# Patient Record
Sex: Female | Born: 1940 | Race: White | Hispanic: No | Smoking: Never smoker
Health system: Southern US, Community
[De-identification: ages and names within clinical notes are randomized; demographics above are authoritative.]

## PROBLEM LIST (undated history)

## (undated) DIAGNOSIS — J449 Chronic obstructive pulmonary disease, unspecified: Secondary | ICD-10-CM

## (undated) DIAGNOSIS — M1990 Unspecified osteoarthritis, unspecified site: Secondary | ICD-10-CM

## (undated) DIAGNOSIS — I10 Essential (primary) hypertension: Secondary | ICD-10-CM

---

## 2006-12-26 ENCOUNTER — Other Ambulatory Visit: Payer: Self-pay

## 2006-12-26 ENCOUNTER — Emergency Department: Payer: Self-pay | Admitting: Emergency Medicine

## 2008-05-03 ENCOUNTER — Inpatient Hospital Stay: Payer: Self-pay | Admitting: Internal Medicine

## 2014-11-02 ENCOUNTER — Encounter: Payer: Self-pay | Admitting: Emergency Medicine

## 2014-11-02 ENCOUNTER — Emergency Department: Payer: Medicare Other

## 2014-11-02 ENCOUNTER — Emergency Department
Admission: EM | Admit: 2014-11-02 | Discharge: 2014-11-02 | Disposition: A | Discharge status: Home / Self Care | Payer: Medicare Other | Attending: Emergency Medicine | Admitting: Emergency Medicine

## 2014-11-02 DIAGNOSIS — Z23 Encounter for immunization: Secondary | ICD-10-CM | POA: Insufficient documentation

## 2014-11-02 DIAGNOSIS — S8002XA Contusion of left knee, initial encounter: Secondary | ICD-10-CM | POA: Diagnosis not present

## 2014-11-02 DIAGNOSIS — Y9389 Activity, other specified: Secondary | ICD-10-CM | POA: Insufficient documentation

## 2014-11-02 DIAGNOSIS — S82209A Unspecified fracture of shaft of unspecified tibia, initial encounter for closed fracture: Secondary | ICD-10-CM

## 2014-11-02 DIAGNOSIS — S8992XA Unspecified injury of left lower leg, initial encounter: Secondary | ICD-10-CM | POA: Insufficient documentation

## 2014-11-02 DIAGNOSIS — S0083XA Contusion of other part of head, initial encounter: Secondary | ICD-10-CM | POA: Diagnosis not present

## 2014-11-02 DIAGNOSIS — S82252A Displaced comminuted fracture of shaft of left tibia, initial encounter for closed fracture: Secondary | ICD-10-CM | POA: Diagnosis not present

## 2014-11-02 DIAGNOSIS — Y9289 Other specified places as the place of occurrence of the external cause: Secondary | ICD-10-CM | POA: Insufficient documentation

## 2014-11-02 DIAGNOSIS — S51811A Laceration without foreign body of right forearm, initial encounter: Secondary | ICD-10-CM | POA: Diagnosis not present

## 2014-11-02 DIAGNOSIS — Y998 Other external cause status: Secondary | ICD-10-CM | POA: Insufficient documentation

## 2014-11-02 DIAGNOSIS — Z880 Allergy status to penicillin: Secondary | ICD-10-CM | POA: Insufficient documentation

## 2014-11-02 DIAGNOSIS — S0993XA Unspecified injury of face, initial encounter: Secondary | ICD-10-CM | POA: Diagnosis present

## 2014-11-02 DIAGNOSIS — T148XXA Other injury of unspecified body region, initial encounter: Secondary | ICD-10-CM

## 2014-11-02 DIAGNOSIS — S82202A Unspecified fracture of shaft of left tibia, initial encounter for closed fracture: Secondary | ICD-10-CM

## 2014-11-02 DIAGNOSIS — S51012A Laceration without foreign body of left elbow, initial encounter: Secondary | ICD-10-CM | POA: Diagnosis not present

## 2014-11-02 DIAGNOSIS — S51019A Laceration without foreign body of unspecified elbow, initial encounter: Secondary | ICD-10-CM

## 2014-11-02 HISTORY — DX: Unspecified osteoarthritis, unspecified site: M19.90

## 2014-11-02 MED ORDER — TETANUS-DIPHTHERIA TOXOIDS TD 5-2 LFU IM INJ
0.5000 mL | INJECTION | Freq: Once | INTRAMUSCULAR | Status: AC
  Administered 2014-11-02: 0.5 mL via INTRAMUSCULAR
  Filled 2014-11-02: qty 0.5

## 2014-11-02 MED ORDER — PREDNISONE 5 MG PO TABS: 5.0000 mg | ORAL_TABLET | Freq: Every day | ORAL | Status: AC

## 2014-11-02 NOTE — ED Notes (Signed)
MD at bedside. 
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
## 2014-11-02 NOTE — ED Notes (Signed)
Patient transported to radiology
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
## 2014-11-02 NOTE — ED Notes (Signed)
Patient transported to CT 

## 2014-11-18 ENCOUNTER — Emergency Department: Payer: Medicare Other

## 2014-11-18 ENCOUNTER — Inpatient Hospital Stay
Admission: EM | Admit: 2014-11-18 | Discharge: 2014-11-23 | DRG: 470 | Disposition: A | Discharge status: Skilled Nursing Facility | Payer: Medicare Other | Attending: Internal Medicine | Admitting: Internal Medicine

## 2014-11-18 DIAGNOSIS — S82152A Displaced fracture of left tibial tuberosity, initial encounter for closed fracture: Secondary | ICD-10-CM

## 2014-11-18 DIAGNOSIS — J449 Chronic obstructive pulmonary disease, unspecified: Secondary | ICD-10-CM | POA: Diagnosis present

## 2014-11-18 DIAGNOSIS — I10 Essential (primary) hypertension: Secondary | ICD-10-CM | POA: Diagnosis present

## 2014-11-18 DIAGNOSIS — Y92008 Other place in unspecified non-institutional (private) residence as the place of occurrence of the external cause: Secondary | ICD-10-CM

## 2014-11-18 DIAGNOSIS — Z7982 Long term (current) use of aspirin: Secondary | ICD-10-CM | POA: Diagnosis not present

## 2014-11-18 DIAGNOSIS — Z833 Family history of diabetes mellitus: Secondary | ICD-10-CM

## 2014-11-18 DIAGNOSIS — Z880 Allergy status to penicillin: Secondary | ICD-10-CM

## 2014-11-18 DIAGNOSIS — Z806 Family history of leukemia: Secondary | ICD-10-CM | POA: Diagnosis not present

## 2014-11-18 DIAGNOSIS — S72002A Fracture of unspecified part of neck of left femur, initial encounter for closed fracture: Secondary | ICD-10-CM | POA: Diagnosis present

## 2014-11-18 DIAGNOSIS — W010XXA Fall on same level from slipping, tripping and stumbling without subsequent striking against object, initial encounter: Secondary | ICD-10-CM | POA: Diagnosis present

## 2014-11-18 DIAGNOSIS — M4854XA Collapsed vertebra, not elsewhere classified, thoracic region, initial encounter for fracture: Secondary | ICD-10-CM | POA: Diagnosis present

## 2014-11-18 DIAGNOSIS — Z419 Encounter for procedure for purposes other than remedying health state, unspecified: Secondary | ICD-10-CM

## 2014-11-18 DIAGNOSIS — D649 Anemia, unspecified: Secondary | ICD-10-CM | POA: Diagnosis present

## 2014-11-18 DIAGNOSIS — Z7983 Long term (current) use of bisphosphonates: Secondary | ICD-10-CM | POA: Diagnosis not present

## 2014-11-18 DIAGNOSIS — Z882 Allergy status to sulfonamides status: Secondary | ICD-10-CM | POA: Diagnosis not present

## 2014-11-18 DIAGNOSIS — E876 Hypokalemia: Secondary | ICD-10-CM | POA: Diagnosis present

## 2014-11-18 DIAGNOSIS — S72009A Fracture of unspecified part of neck of unspecified femur, initial encounter for closed fracture: Secondary | ICD-10-CM | POA: Diagnosis present

## 2014-11-18 DIAGNOSIS — M069 Rheumatoid arthritis, unspecified: Secondary | ICD-10-CM | POA: Diagnosis present

## 2014-11-18 DIAGNOSIS — I340 Nonrheumatic mitral (valve) insufficiency: Secondary | ICD-10-CM | POA: Diagnosis present

## 2014-11-18 DIAGNOSIS — Z8249 Family history of ischemic heart disease and other diseases of the circulatory system: Secondary | ICD-10-CM | POA: Diagnosis not present

## 2014-11-18 DIAGNOSIS — Z7952 Long term (current) use of systemic steroids: Secondary | ICD-10-CM | POA: Diagnosis not present

## 2014-11-18 DIAGNOSIS — Z79899 Other long term (current) drug therapy: Secondary | ICD-10-CM

## 2014-11-18 DIAGNOSIS — M810 Age-related osteoporosis without current pathological fracture: Secondary | ICD-10-CM | POA: Diagnosis present

## 2014-11-18 DIAGNOSIS — G8918 Other acute postprocedural pain: Secondary | ICD-10-CM

## 2014-11-18 DIAGNOSIS — S82209A Unspecified fracture of shaft of unspecified tibia, initial encounter for closed fracture: Secondary | ICD-10-CM

## 2014-11-18 DIAGNOSIS — S72001A Fracture of unspecified part of neck of right femur, initial encounter for closed fracture: Secondary | ICD-10-CM

## 2014-11-18 HISTORY — DX: Chronic obstructive pulmonary disease, unspecified: J44.9

## 2014-11-18 HISTORY — DX: Essential (primary) hypertension: I10

## 2014-11-18 LAB — BASIC METABOLIC PANEL
Anion gap: 13 (ref 5–15)
BUN: 22 mg/dL — AB (ref 6–20)
CHLORIDE: 108 mmol/L (ref 101–111)
CO2: 21 mmol/L — ABNORMAL LOW (ref 22–32)
CREATININE: 0.79 mg/dL (ref 0.44–1.00)
Calcium: 7.8 mg/dL — ABNORMAL LOW (ref 8.9–10.3)
GFR calc Af Amer: >60 mL/min (ref >60)
GFR calc non Af Amer: >60 mL/min (ref >60)
Glucose, Bld: 99 mg/dL (ref 65–99)
Potassium: 3.5 mmol/L (ref 3.5–5.1)
SODIUM: 142 mmol/L (ref 135–145)

## 2014-11-18 LAB — CBC WITH DIFFERENTIAL/PLATELET
Basophils Absolute: 0.1 10*3/uL (ref 0–0.1)
Basophils Relative: 1 %
EOS ABS: 0.1 10*3/uL (ref 0–0.7)
EOS PCT: 2 %
HCT: 31.9 % — ABNORMAL LOW (ref 35.0–47.0)
Hemoglobin: 10.5 g/dL — ABNORMAL LOW (ref 12.0–16.0)
LYMPHS ABS: 0.4 10*3/uL — AB (ref 1.0–3.6)
Lymphocytes Relative: 6 %
MCH: 32.5 pg (ref 26.0–34.0)
MCHC: 32.8 g/dL (ref 32.0–36.0)
MCV: 99 fL (ref 80.0–100.0)
Monocytes Absolute: 0.4 10*3/uL (ref 0.2–0.9)
Monocytes Relative: 5 %
Neutro Abs: 6.4 10*3/uL (ref 1.4–6.5)
Neutrophils Relative %: 86 %
PLATELETS: 317 10*3/uL (ref 150–440)
RBC: 3.23 MIL/uL — AB (ref 3.80–5.20)
RDW: 16.3 % — AB (ref 11.5–14.5)
WBC: 7.4 10*3/uL (ref 3.6–11.0)

## 2014-11-18 LAB — PROTIME-INR
INR: 1.19
PROTHROMBIN TIME: 15.3 s — AB (ref 11.4–15.0)

## 2014-11-18 LAB — CK: Total CK: 338 U/L — ABNORMAL HIGH (ref 38–234)

## 2014-11-18 MED ORDER — CALCIUM CARBONATE-VITAMIN D 500-200 MG-UNIT PO TABS
1.0000 | ORAL_TABLET | Freq: Two times a day (BID) | ORAL | Status: DC
  Administered 2014-11-18 – 2014-11-23 (×8): 1 via ORAL
  Filled 2014-11-18 (×9): qty 1

## 2014-11-18 MED ORDER — MORPHINE SULFATE (PF) 2 MG/ML IV SOLN: 2.0000 mg | INTRAVENOUS | Status: DC | PRN

## 2014-11-18 MED ORDER — ENOXAPARIN SODIUM 40 MG/0.4ML ~~LOC~~ SOLN
40.0000 mg | SUBCUTANEOUS | Status: DC
  Administered 2014-11-18: 40 mg via SUBCUTANEOUS
  Filled 2014-11-18: qty 0.4

## 2014-11-18 MED ORDER — MAGNESIUM HYDROXIDE 400 MG/5ML PO SUSP: 30.0000 mL | Freq: Every day | ORAL | Status: DC | PRN

## 2014-11-18 MED ORDER — HYDROCODONE-ACETAMINOPHEN 5-325 MG PO TABS
1.0000 | ORAL_TABLET | Freq: Four times a day (QID) | ORAL | Status: DC | PRN
  Administered 2014-11-19: 1 via ORAL
  Filled 2014-11-18: qty 1

## 2014-11-18 MED ORDER — ZOLPIDEM TARTRATE 5 MG PO TABS
5.0000 mg | ORAL_TABLET | Freq: Every evening | ORAL | Status: DC | PRN
  Administered 2014-11-22: 5 mg via ORAL
  Filled 2014-11-18: qty 1

## 2014-11-18 MED ORDER — SODIUM CHLORIDE 0.9 % IV SOLN
INTRAVENOUS | Status: DC
  Administered 2014-11-18 – 2014-11-19 (×2): via INTRAVENOUS

## 2014-11-18 MED ORDER — CALCIUM CARBONATE-VITAMIN D 600-400 MG-UNIT PO TABS: 1.0000 | ORAL_TABLET | Freq: Two times a day (BID) | ORAL | Status: DC

## 2014-11-18 MED ORDER — PREDNISONE 5 MG PO TABS
5.0000 mg | ORAL_TABLET | Freq: Every day | ORAL | Status: DC
  Administered 2014-11-19 – 2014-11-23 (×3): 5 mg via ORAL
  Filled 2014-11-18 (×3): qty 1

## 2014-11-18 MED ORDER — MORPHINE SULFATE (PF) 2 MG/ML IV SOLN
2.0000 mg | INTRAVENOUS | Status: DC | PRN
  Administered 2014-11-18 – 2014-11-19 (×2): 2 mg via INTRAVENOUS
  Filled 2014-11-18 (×2): qty 1

## 2014-11-18 MED ORDER — LISINOPRIL 20 MG PO TABS
20.0000 mg | ORAL_TABLET | Freq: Every day | ORAL | Status: DC
  Administered 2014-11-18 – 2014-11-23 (×4): 20 mg via ORAL
  Filled 2014-11-18 (×4): qty 1

## 2014-11-18 MED ORDER — DOCUSATE SODIUM 100 MG PO CAPS
100.0000 mg | ORAL_CAPSULE | Freq: Two times a day (BID) | ORAL | Status: DC
  Administered 2014-11-18 – 2014-11-23 (×7): 100 mg via ORAL
  Filled 2014-11-18 (×9): qty 1

## 2014-11-18 NOTE — H&P (Addendum)
[facility] [place] at [facility]   PATIENT NAME: Morgan Patterson    MR#:  [id number]  DATE OF BIRTH:  1940/04/19  DATE OF ADMISSION:  11/18/2014  PRIMARY CARE PHYSICIAN: Kandyce Rud, MD   REQUESTING/REFERRING PHYSICIAN: Scotty Court  CHIEF COMPLAINT:   Chief Complaint  Patient presents with  . Fall    HISTORY OF PRESENT ILLNESS: Morgan Patterson  is a 74 y.o. female with a known history of arthritis, hypertension, COPD- tripped over her carpet on Saturday night in her back porch, and since then she had severe pain in her left hip she could not get up. She stayed in the porch Saturday night just slept they're in the next day in the morning when she woke up she opened the door and is post herself inside, then she stayed on the floor and could not get up finally today called her causing who came to her house and he helped her to get in the bed and then called the EMS. In ER noted to have fracture on left hip. Orthopedics is planning to surgery day after tomorrow.  PAST MEDICAL HISTORY:   Past Medical History  Diagnosis Date  . Arthritis   . Hypertension   . COPD (chronic obstructive pulmonary disease) (HCC)     PAST SURGICAL HISTORY: No past surgical history on file.  SOCIAL HISTORY:  Social History  Substance Use Topics  . Smoking status: Never Smoker   . Smokeless tobacco: Not on file  . Alcohol Use: No    FAMILY HISTORY:  Family History  Problem Relation Age of Onset  . Diabetes Mother   . CAD Mother   . Hypertension Mother   . Leukemia Father     DRUG ALLERGIES:  Allergies  Allergen Reactions  . Penicillins Itching, Swelling and Other (See Comments)    Has patient had a PCN reaction causing immediate rash, facial/tongue/throat swelling, SOB or lightheadedness with hypotension: No Has patient had a PCN reaction causing severe rash involving mucus membranes or skin necrosis: No Has patient had a PCN reaction that required hospitalization  No Has patient had a PCN reaction occurring within the last 10 years: No If all of the above answers are "NO", then may proceed with Cephalosporin use.  . Sulfa Antibiotics Itching and Swelling    REVIEW OF SYSTEMS:   CONSTITUTIONAL: No fever, fatigue or weakness.  EYES: No blurred or double vision.  EARS, NOSE, AND THROAT: No tinnitus or ear pain.  RESPIRATORY: No cough, shortness of breath, wheezing or hemoptysis.  CARDIOVASCULAR: No chest pain, orthopnea, edema.  GASTROINTESTINAL: No nausea, vomiting, diarrhea or abdominal pain.  GENITOURINARY: No dysuria, hematuria.  ENDOCRINE: No polyuria, nocturia,  HEMATOLOGY: No anemia, easy bruising or bleeding SKIN: No rash or lesion. MUSCULOSKELETAL: severe left hip joint pain or arthritis.   NEUROLOGIC: No tingling, numbness, weakness.  PSYCHIATRY: No anxiety or depression.   MEDICATIONS AT HOME:  Prior to Admission medications   Medication Sig Start Date End Date Taking? Authorizing Provider  alendronate (FOSAMAX) 70 MG tablet Take 70 mg by mouth once a week. Pt takes on Friday.   Take with a full glass of water on an empty stomach.   Yes Historical Provider, MD  aspirin 500 MG tablet Take 1,000-1,500 mg by mouth 2 (two) times daily. Pt takes three tablets in the morning and two tablets at bedtime.   Yes Historical Provider, MD  Calcium Carbonate-Vitamin D (CALCIUM 600+D) 600-400 MG-UNIT tablet Take 1 tablet  by mouth 2 (two) times daily.   Yes Historical Provider, MD  lisinopril (PRINIVIL,ZESTRIL) 20 MG tablet Take 20 mg by mouth daily.   Yes Historical Provider, MD  methotrexate (RHEUMATREX) 2.5 MG tablet Take 22.5 mg by mouth once a week. Pt takes on Friday.   Caution:Chemotherapy. Protect from light.   Yes Historical Provider, MD  Multiple Vitamin (MULTIVITAMIN WITH MINERALS) TABS tablet Take 1 tablet by mouth daily.   Yes Historical Provider, MD  predniSONE (DELTASONE) 5 MG tablet Take 1 tablet (5 mg total) by mouth daily with  breakfast. 11/02/14  Yes Myrna Blazer, MD      PHYSICAL EXAMINATION:   VITAL SIGNS: Blood pressure 144/77, pulse 73, temperature 97.9 F (36.6 C), resp. rate 16, height 5\' 1"  (1.549 m), weight 54.432 kg (120 lb), SpO2 100 %.  GENERAL:  74 y.o.-year-old patient lying in the bed with no acute distress.  EYES: Pupils equal, round, reactive to light and accommodation. No scleral icterus. Extraocular muscles intact.  HEENT: Head atraumatic, normocephalic. Oropharynx and nasopharynx clear.  NECK:  Supple, no jugular venous distention. No thyroid enlargement, no tenderness.  LUNGS: Normal breath sounds bilaterally, no wheezing, rales,rhonchi or crepitation. No use of accessory muscles of respiration.  CARDIOVASCULAR: S1, S2 normal. Systolic murmurs present.  ABDOMEN: Soft, nontender, nondistended. Bowel sounds present. No organomegaly or mass.  EXTREMITIES:  Left lower extremity externally rotated and shortening, also have some bruises around her knee on the left side, and bluish color around knee because of bleeding. Able to move her toes and her feet distal pulses are intact and sensations are intact. NEUROLOGIC: Cranial nerves II through XII are intact. Muscle strength 5/5 in all extremities except left lower due to fracture. Sensation intact. Gait not checked.  PSYCHIATRIC: The patient is alert and oriented x 3.  SKIN: No obvious rash, lesion, or ulcer.   LABORATORY PANEL:   CBC  Recent Labs Lab 11/18/14 1807  WBC 7.4  HGB 10.5*  HCT 31.9*  PLT 317  MCV 99.0  MCH 32.5  MCHC 32.8  RDW 16.3*  LYMPHSABS 0.4*  MONOABS 0.4  EOSABS 0.1  BASOSABS 0.1   ------------------------------------------------------------------------------------------------------------------  Chemistries   Recent Labs Lab 11/18/14 1807  NA 142  K 3.5  CL 108  CO2 21*  GLUCOSE 99  BUN 22*  CREATININE 0.79  CALCIUM 7.8*    ------------------------------------------------------------------------------------------------------------------ estimated creatinine clearance is 45.8 mL/min (by C-G formula based on Cr of 0.79). ------------------------------------------------------------------------------------------------------------------ No results for input(s): TSH, T4TOTAL, T3FREE, THYROIDAB in the last 72 hours.  Invalid input(s): FREET3   Coagulation profile  Recent Labs Lab 11/18/14 1807  INR 1.19   ------------------------------------------------------------------------------------------------------------------- No results for input(s): DDIMER in the last 72 hours. -------------------------------------------------------------------------------------------------------------------  Cardiac Enzymes No results for input(s): CKMB, TROPONINI, MYOGLOBIN in the last 168 hours.  Invalid input(s): CK ------------------------------------------------------------------------------------------------------------------ Invalid input(s): POCBNP  ---------------------------------------------------------------------------------------------------------------  Urinalysis No results found for: COLORURINE, APPEARANCEUR, LABSPEC, PHURINE, GLUCOSEU, HGBUR, BILIRUBINUR, KETONESUR, PROTEINUR, UROBILINOGEN, NITRITE, LEUKOCYTESUR   RADIOLOGY: Dg Chest 1 View  11/18/2014  CLINICAL DATA:  Pain following fall EXAM: CHEST 1 VIEW COMPARISON:  May 05, 2008 FINDINGS: There is no edema or consolidation. The heart size and pulmonary vascularity are normal. No adenopathy. There is calcification in the mitral annulus. There are multiple wedge compression fractures throughout the mid and lower thoracic spine. IMPRESSION: Multiple age uncertain compression fractures in the thoracic spine. No edema or consolidation. No pneumothorax. Electronically Signed   By: May 07, 2008 III M.D.  On: 11/18/2014 17:51   Dg Hip Unilat With Pelvis  2-3 Views Left  11/18/2014  CLINICAL DATA:  Larey Seat Saturday onto buttocks, LEFT hip pain and deformity, has been lying in bed for 3 days, unable to walk EXAM: DG HIP (WITH OR WITHOUT PELVIS) 2-3V LEFT COMPARISON:  None FINDINGS: Diffuse osseous demineralization. Minimal narrowing of RIGHT hip joint. LEFT hip joint and SI joints preserved. Displaced LEFT femoral neck fracture without dislocation. Osseous pelvis appears intact. Facet degenerative changes at visualized lower lumbar spine. IMPRESSION: Displaced LEFT femoral neck fracture. Osseous demineralization. Electronically Signed   By: Ulyses Southward M.D.   On: 11/18/2014 17:53    IMPRESSION AND PLAN: * Left hip fracture  Seen by orthopedics, plan for surgery day after tomorrow.  Meanwhile DVT prophylaxis and pain management per orthopedic team.  Urinalysis is not checked I will check it.  * Possible rhabdomyolysis  Patient stayed on the floor for 3 days  Her CK level is not checked I will check that.  Give IV fluid if required, kidney function is acceptable.  * Cardiac murmurs  We does not have echocardiogram in system, we'll get echo.  * COPD  No active wheezing, continue monitoring.  * Rheumatoid arthritis  She takes methotrexate and steroids at home  Continue after surgery.  * Osteoporosis  Takes calcium and vitamin D, continue after surgery.    All the records are reviewed and case discussed with ED provider. Management plans discussed with the patient, family and they are in agreement.  CODE STATUS:    Code Status Orders        Start     Ordered   11/18/14 1921  Full code   Continuous     11/18/14 1925       TOTAL TIME TAKING CARE OF THIS PATIENT: 45 minutes.    Altamese Dilling M.D on 11/18/2014   Between 7am to 6pm - Pager - [phone]  After 6pm go to [email or web address] - password [email or web address] [facility]  [facility]  Office  [phone]  CC: Primary care physician; Kandyce Rud,  MD   Note: This dictation was prepared with Dragon dictation along with smaller phrase technology. Any transcriptional errors that result from this process are unintentional.

## 2014-11-18 NOTE — Progress Notes (Signed)
Pt. Has valuables locked up in locker.

## 2014-11-18 NOTE — ED Notes (Signed)
Pt arrived from home via [facility], fell Sat and C/O L. Knee and hip pain. Taking aspirin

## 2014-11-18 NOTE — Consult Note (Signed)
Patient suffered a fall this weekend. She has had a recent proximal tibia fracture. She has been walking with one crutch. 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 She is treated by [facility] for that condition.  She has been in bed since her fall she called in from the back porch where she tripped and is having left hip pain.  Exam the left leg is shortened external rotated. She is able flex extend the toes. She has started herself pedis posterior tibial pulse and there is no significant peripheral edema.  X-rays show displaced left femoral neck fracture complete displacement.  Impression displaced femoral neck fracture in patient with inflammatory arthritis  Plan of left total hip arthritis the direct anterior approach probably the day after tomorrow to allow for medical stabilization with her being in bed for 3 days. Risks benefits, and possible complications discussed. Brochure regarding the procedure was also given to patient. We'll obtain the x-ray to make sure tibial tuberosity fracture is not displaced

## 2014-11-18 NOTE — ED Provider Notes (Signed)
[facility] Emergency Department Provider Note  ____________________________________________  Time seen: 5:10 PM  I have reviewed the triage vital signs and the nursing notes.   HISTORY  Chief Complaint Fall    HPI Morgan Patterson is a 74 y.o. female who is been walking around using a crutch recently due to a recent left knee injury 2 weeks ago. On returning home from a shopping trip 3 days ago, she was walking into the house with the crotch when it got stuck in the carpet causing her to spin around and fall onto her back left side.Denies other injuries, no head injury loss of consciousness or neck pain. No numbness [place] or weakness in the leg. She's been unable to get out of bed for the past 3 days.     Past Medical History  Diagnosis Date  . Arthritis      There are no active problems to display for this patient.    No past surgical history on file.   Current Outpatient Rx  Name  Route  Sig  Dispense  Refill  . predniSONE (DELTASONE) 5 MG tablet   Oral   Take 1 tablet (5 mg total) by mouth daily with breakfast.   7 tablet   0      Allergies Penicillins   No family history on file.  Social History Social History  Substance Use Topics  . Smoking status: None  . Smokeless tobacco: None  . Alcohol Use: None   denies tobacco alcohol or drug use  Review of Systems  Constitutional:   No fever or chills. No weight changes Eyes:   No blurry vision or double vision.  ENT:   No sore throat. Cardiovascular:   No chest pain. Respiratory:   No dyspnea or cough. Gastrointestinal:   Negative for abdominal pain, vomiting and diarrhea.  No BRBPR or melena. Genitourinary:   Negative for dysuria, urinary retention, bloody urine, or difficulty urinating. Musculoskeletal:   Positive left knee and left hip pain Skin:   Negative for rash. Neurological:   Negative for headaches, focal weakness or numbness. Psychiatric:  No anxiety or  depression.   Endocrine:  No hot/cold intolerance, changes in energy, or sleep difficulty.  10-point ROS otherwise negative.  ____________________________________________   PHYSICAL EXAM:  VITAL SIGNS: ED Triage Vitals  Enc Vitals Group     BP 11/18/14 1708 137/82 mmHg     Pulse Rate 11/18/14 1708 111     Resp 11/18/14 1708 16     Temp 11/18/14 1708 97.9 F (36.6 C)     Temp src --      SpO2 11/18/14 1708 100 %     Weight 11/18/14 1708 120 lb (54.432 kg)     Height 11/18/14 1708 5\' 1"  (1.549 m)     Head Cir --      Peak Flow --      Pain Score 11/18/14 1712 2     Pain Loc --      Pain Edu? --      Excl. in GC? --      Constitutional:   Alert and oriented. Well appearing and in no distress. Eyes:   No scleral icterus. No conjunctival pallor. PERRL. EOMI ENT   Head:   Normocephalic and atraumatic.   Nose:   No congestion/rhinnorhea. No septal hematoma   Mouth/Throat:   MMM, no pharyngeal erythema. No peritonsillar mass. No uvula shift.   Neck:   No stridor. No SubQ emphysema. No  meningismus. Hematological/Lymphatic/Immunilogical:   No cervical lymphadenopathy. Cardiovascular:   RRR. Normal and symmetric distal pulses are present in all extremities. No murmurs, rubs, or gallops. Respiratory:   Normal respiratory effort without tachypnea nor retractions. Breath sounds are clear and equal bilaterally. No wheezes/rales/rhonchi. Gastrointestinal:   Soft and nontender. No distention. There is no CVA tenderness.  No rebound, rigidity, or guarding. Genitourinary:   deferred Musculoskeletal:   Focal left hip tenderness. There is ecchymosis over the left knee but the area is grossly nontender. Other extremity is unremarkable.. Neurologic:   Normal speech and language.  CN 2-10 normal. Motor grossly intact. No pronator drift.  Normal gait. No gross focal neurologic deficits are appreciated.  Skin:    Skin is warm, dry and intact. No rash noted.  No petechiae,  purpura, or bullae. Psychiatric:   Mood and affect are normal. Speech and behavior are normal. Patient exhibits appropriate insight and judgment.  ____________________________________________    LABS (pertinent positives/negatives) (all labs ordered are listed, but only abnormal results are displayed) Labs Reviewed  BASIC METABOLIC PANEL  CBC WITH DIFFERENTIAL/PLATELET  PROTIME-INR   ____________________________________________   EKG  Interpreted by me Sinus tachycardia rate 116, significant respiratory variation, left axis, normal intervals. Normal QRS ST segments and T waves.  ____________________________________________    RADIOLOGY  X-ray left hip shows a left femoral neck fracture. Chest x-ray shows multiple T spine compression fractures  ____________________________________________   PROCEDURES   ____________________________________________   INITIAL IMPRESSION / ASSESSMENT AND PLAN / ED COURSE  Pertinent labs & imaging results that were available during my care of the patient were reviewed by me and considered in my medical decision making (see chart for details).  Patient presents with left hip fracture after a fall. Chest x-ray concerning for compression fractures of the thoracic spine, however the patient has no pain complaints in that area at this time and so this is likely not acute. Given the patient has been unable to get out of bed for the last 3 days after her fall, We'll discuss with orthopedics and hospitalist for further management.     ____________________________________________   FINAL CLINICAL IMPRESSION(S) / ED DIAGNOSES  Final diagnoses:  Displaced fracture of right femoral neck, closed, initial encounter [facility])      Morgan Cheek, MD 11/18/14 1826

## 2014-11-19 ENCOUNTER — Inpatient Hospital Stay
Admit: 2014-11-19 | Discharge: 2014-11-19 | Disposition: A | Discharge status: Home / Self Care | Payer: Medicare Other | Attending: Internal Medicine | Admitting: Internal Medicine

## 2014-11-19 LAB — URINALYSIS COMPLETE WITH MICROSCOPIC (ARMC ONLY)
BILIRUBIN URINE: NEGATIVE
Bacteria, UA: NONE SEEN
GLUCOSE, UA: NEGATIVE mg/dL
HGB URINE DIPSTICK: NEGATIVE
LEUKOCYTES UA: NEGATIVE
NITRITE: NEGATIVE
Protein, ur: 30 mg/dL — AB
SPECIFIC GRAVITY, URINE: 1.025 (ref 1.005–1.030)
pH: 5 (ref 5.0–8.0)

## 2014-11-19 LAB — BASIC METABOLIC PANEL
Anion gap: 8 (ref 5–15)
BUN: 28 mg/dL — AB (ref 6–20)
CHLORIDE: 112 mmol/L — AB (ref 101–111)
CO2: 22 mmol/L (ref 22–32)
CREATININE: 0.83 mg/dL (ref 0.44–1.00)
Calcium: 8.2 mg/dL — ABNORMAL LOW (ref 8.9–10.3)
GFR calc non Af Amer: >60 mL/min (ref >60)
Glucose, Bld: 86 mg/dL (ref 65–99)
Potassium: 3.3 mmol/L — ABNORMAL LOW (ref 3.5–5.1)
Sodium: 142 mmol/L (ref 135–145)

## 2014-11-19 LAB — MAGNESIUM: Magnesium: 2.2 mg/dL (ref 1.7–2.4)

## 2014-11-19 LAB — CBC
HEMATOCRIT: 30.9 % — AB (ref 35.0–47.0)
Hemoglobin: 10.5 g/dL — ABNORMAL LOW (ref 12.0–16.0)
MCH: 33.7 pg (ref 26.0–34.0)
MCHC: 34 g/dL (ref 32.0–36.0)
MCV: 99.2 fL (ref 80.0–100.0)
PLATELETS: 303 10*3/uL (ref 150–440)
RBC: 3.12 MIL/uL — ABNORMAL LOW (ref 3.80–5.20)
RDW: 16.1 % — ABNORMAL HIGH (ref 11.5–14.5)
WBC: 6.1 10*3/uL (ref 3.6–11.0)

## 2014-11-19 LAB — SURGICAL PCR SCREEN
MRSA, PCR: NEGATIVE
Staphylococcus aureus: POSITIVE — AB

## 2014-11-19 MED ORDER — POTASSIUM CHLORIDE CRYS ER 20 MEQ PO TBCR
20.0000 meq | EXTENDED_RELEASE_TABLET | Freq: Two times a day (BID) | ORAL | Status: AC
  Administered 2014-11-19 (×2): 20 meq via ORAL
  Filled 2014-11-19 (×2): qty 1

## 2014-11-19 MED ORDER — IPRATROPIUM-ALBUTEROL 0.5-2.5 (3) MG/3ML IN SOLN
3.0000 mL | RESPIRATORY_TRACT | Status: DC | PRN
  Administered 2014-11-22 – 2014-11-23 (×2): 3 mL via RESPIRATORY_TRACT
  Filled 2014-11-19 (×2): qty 3

## 2014-11-19 MED ORDER — POTASSIUM CHLORIDE IN NACL 20-0.9 MEQ/L-% IV SOLN
INTRAVENOUS | Status: DC
  Administered 2014-11-19: 15:00:00 via INTRAVENOUS
  Filled 2014-11-19 (×2): qty 1000

## 2014-11-19 MED ORDER — ENSURE ENLIVE PO LIQD
237.0000 mL | Freq: Three times a day (TID) | ORAL | Status: DC
  Administered 2014-11-19 – 2014-11-23 (×9): 237 mL via ORAL

## 2014-11-19 MED ORDER — CLINDAMYCIN PHOSPHATE 600 MG/50ML IV SOLN
600.0000 mg | INTRAVENOUS | Status: AC
  Filled 2014-11-19: qty 50

## 2014-11-19 NOTE — Progress Notes (Signed)
Patient appears comfortable. She had follow-up x-ray last evening and will see if Dr. Martha Clan can see her today to evaluate that tibial tubercle fracture and whether it might need ORIF and if we can do the same time. Plan on left anterior total hip tomorrow morning for displaced femoral neck fracture and inflammatory arthritis. We'll need perioperative steroid coverage

## 2014-11-19 NOTE — Progress Notes (Signed)
Initial Nutrition Assessment   INTERVENTION:   Meals and Snacks: Cater to patient preferences Medical Food Supplement Therapy: will recommend Ensure Enlive po BID, each supplement provides 350 kcal and 20 grams of protein   NUTRITION DIAGNOSIS:   Increased nutrient needs related to acute illness as evidenced by estimated needs.  GOAL:   Patient will meet greater than or equal to 90% of their needs  MONITOR:    (Energy Intake, Anthropometrics, Electrolyte and renal Profile)  REASON FOR ASSESSMENT:   Consult Hip fracture protocol  ASSESSMENT:   Pt admitted with left hip fracture scheduled for surgical intervention tomorrow. Per MD note, pt remained lying on the floor for 3 days PTA with possible rhabdomylosis.,  Past Medical History  Diagnosis Date  . Arthritis   . Hypertension   . COPD (chronic obstructive pulmonary disease) (HCC)     Diet Order:  Diet regular Room service appropriate?: Yes; Fluid consistency:: Thin Diet NPO time specified    Current Nutrition: Pt ate bites of chicken salad sandwich and green beans for lunch. Pt reports eating well at breakfast however bites recorded in I/O chart.   Food/Nutrition-Related History: Pt reports eating a hamburger at lunch around 2pm and eating salad from [facility] for example for dinner most days. Pt reports liking Ensure drinking in the past.   Scheduled Medications:  . calcium-vitamin D  1 tablet Oral BID  . clindamycin (CLEOCIN) IV  600 mg Intravenous On Call to OR  . docusate sodium  100 mg Oral BID  . feeding supplement (ENSURE ENLIVE)  237 mL Oral TID WC  . lisinopril  20 mg Oral Daily  . potassium chloride  20 mEq Oral BID  . predniSONE  5 mg Oral Q breakfast    Continuous Medications:  . 0.9 % NaCl with KCl 20 mEq / L 50 mL/hr at 11/19/14 1504     Electrolyte/Renal Profile and Glucose Profile:   Recent Labs Lab 11/18/14 1807 11/18/14 2305 11/19/14 0544  NA 142  --  142  K 3.5  --  3.3*  CL 108   --  112*  CO2 21*  --  22  BUN 22*  --  28*  CREATININE 0.79  --  0.83  CALCIUM 7.8*  --  8.2*  MG  --  2.2  --   GLUCOSE 99  --  86   Protein Profile: No results for input(s): ALBUMIN in the last 168 hours.  Gastrointestinal Profile: Last BM:  11/19/2014   Nutrition-Focused Physical Exam Findings: Nutrition-Focused physical exam completed. Findings are no fat depletion, mild muscle depletion of thigh only, and no edema.     Weight Change: pt reports UBW of 116-120lbs. Pt reports weighing 135lbs many years ago    Height:   Ht Readings from Last 1 Encounters:  11/19/14 5\' 1"  (1.549 m)    Weight:   Wt Readings from Last 1 Encounters:  11/19/14 117 lb 14.4 oz (53.479 kg)    Ideal Body Weight:     BMI:  Body mass index is 22.29 kg/(m^2).  Estimated Nutritional Needs:   Kcal:  BEE: 967kcals, TEE: (IF 1.1-1.3)(AF 1.2) 1276-1509kcals  Protein:  59-70g protein (1.1-1.3g/kg)  Fluid:  1338-1655mL of fluid (25-58mL/kg)  EDUCATION NEEDS:   No education needs identified at this time    MODERATE Care Level  31m, RD, LDN Pager [phone]

## 2014-11-19 NOTE — Clinical Social Work Placement (Signed)
   CLINICAL SOCIAL WORK PLACEMENT  NOTE  Date:  11/19/2014  Patient Details  Name: Morgan Patterson MRN: [id number] Date of Birth: [birth year]  Clinical Social Work is seeking post-discharge placement for this patient at the Skilled  Nursing Facility level of care (*CSW will initial, date and re-position this form in  chart as items are completed):  Yes   Patient/family provided with [place] Clinical Social Work Department's list of facilities offering this level of care within the geographic area requested by the patient (or if unable, by the patient's family).  Yes   Patient/family informed of their freedom to choose among providers that offer the needed level of care, that participate in Medicare, Medicaid or managed care program needed by the patient, have an available bed and are willing to accept the patient.  Yes   Patient/family informed of [facility]'s ownership interest in [facility] and [facility], as well as of the fact that they are under no obligation to receive care at these facilities.  PASRR submitted to EDS on       PASRR number received on       Existing PASRR number confirmed on 11/19/14     FL2 transmitted to all facilities in geographic area requested by pt/family on 11/19/14     FL2 transmitted to all facilities within larger geographic area on       Patient informed that his/her managed care company has contracts with or will negotiate with certain facilities, including the following:            Patient/family informed of bed offers received.  Patient chooses bed at       Physician recommends and patient chooses bed at      Patient to be transferred to   on  .  Patient to be transferred to facility by       Patient family notified on   of transfer.  Name of family member notified:        PHYSICIAN Please sign FL2     Additional Comment:    _______________________________________________ Haig Prophet, LCSW 11/19/2014, 10:20  AM

## 2014-11-19 NOTE — NC FL2 (Signed)
MEDICAID FL2 LEVEL OF CARE SCREENING TOOL     IDENTIFICATION  Patient Name: Morgan Patterson Birthdate: Jul 08, 1940 Sex: female Admission Date (Current Location): 11/18/2014  [place] and [place] Number:  Air cabin crew )   Facility and Address:  [facility], [address] [place], [place] [address]      Provider Number: [id number] [phone])  Attending Physician Name and Address:  Katharina Caper, MD  Relative Name and Phone Number:       Current Level of Care: Hospital Recommended Level of Care: Skilled Nursing Facility Prior Approval Number:    Date Approved/Denied:   PASRR Number:  [id number] )  Discharge Plan: SNF    Current Diagnoses: Patient Active Problem List   Diagnosis Date Noted  . Hip fracture, left (HCC) 11/18/2014  . Hip fracture (HCC) 11/18/2014    Orientation ACTIVITIES/SOCIAL BLADDER RESPIRATION    Self, Time, Situation, Place  Active Continent Normal  BEHAVIORAL SYMPTOMS/MOOD NEUROLOGICAL BOWEL NUTRITION STATUS   (none)  (none) Continent Diet (NPO for surgery )  PHYSICIAN VISITS COMMUNICATION OF NEEDS Height & Weight Skin  30 days Verbally 5\' 1"  (154.9 cm) 120 lbs. Surgical wounds (Incision: Left Hip. )          AMBULATORY STATUS RESPIRATION    Assist extensive Normal      Personal Care Assistance Level of Assistance  Bathing, Feeding, Dressing Bathing Assistance: Limited assistance Feeding assistance: Independent Dressing Assistance: Limited assistance      Functional Limitations Info  Sight, Hearing, Speech Sight Info: Adequate Hearing Info: Adequate Speech Info: Adequate       SPECIAL CARE FACTORS FREQUENCY  PT (By licensed PT), OT (By licensed OT)     PT Frequency:  (5) OT Frequency:  (5)           Additional Factors Info  Code Status, Allergies (Full Code. ) Code Status Info:  (Full Code. ) Allergies Info:  (Penicillins, Sulfa Antibiotics)           Current Medications  (11/19/2014): Current Facility-Administered Medications  Medication Dose Route Frequency Provider Last Rate Last Dose  . 0.9 %  sodium chloride infusion   Intravenous Continuous 13/02/2014, MD 75 mL/hr at 11/19/14 0956    . calcium-vitamin D (OSCAL WITH D) 500-200 MG-UNIT per tablet 1 tablet  1 tablet Oral BID 13/02/16, MD   1 tablet at 11/19/14 0954  . clindamycin (CLEOCIN) IVPB 600 mg  600 mg Intravenous On Call to OR 13/02/16, MD      . docusate sodium (COLACE) capsule 100 mg  100 mg Oral BID Kennedy Bucker, MD   100 mg at 11/19/14 0954  . enoxaparin (LOVENOX) injection 40 mg  40 mg Subcutaneous Q24H 13/02/16, MD   40 mg at 11/18/14 2235  . HYDROcodone-acetaminophen (NORCO/VICODIN) 5-325 MG per tablet 1-2 tablet  1-2 tablet Oral Q6H PRN 2236, MD   1 tablet at 11/19/14 1001  . lisinopril (PRINIVIL,ZESTRIL) tablet 20 mg  20 mg Oral Daily 13/02/16, MD   20 mg at 11/19/14 0954  . magnesium hydroxide (MILK OF MAGNESIA) suspension 30 mL  30 mL Oral Daily PRN 13/02/16, MD      . morphine 2 MG/ML injection 2 mg  2 mg Intravenous Q4H PRN Kennedy Bucker, MD   2 mg at 11/18/14 2235  . predniSONE (DELTASONE) tablet 5 mg  5 mg Oral Q breakfast 2236, MD   5 mg at 11/19/14 0954  . zolpidem (AMBIEN)  tablet 5 mg  5 mg Oral QHS PRN Kennedy Bucker, MD       Do not use this list as official medication orders. Please verify with discharge summary.  Discharge Medications:   Medication List    ASK your doctor about these medications        alendronate 70 MG tablet  Commonly known as:  FOSAMAX  Take 70 mg by mouth once a week. Pt takes on Friday.   Take with a full glass of water on an empty stomach.     aspirin 500 MG tablet  Take 1,000-1,500 mg by mouth 2 (two) times daily. Pt takes three tablets in the morning and two tablets at bedtime.     CALCIUM 600+D 600-400 MG-UNIT tablet  Generic drug:  Calcium Carbonate-Vitamin  D  Take 1 tablet by mouth 2 (two) times daily.     lisinopril 20 MG tablet  Commonly known as:  PRINIVIL,ZESTRIL  Take 20 mg by mouth daily.     methotrexate 2.5 MG tablet  Commonly known as:  RHEUMATREX  Take 22.5 mg by mouth once a week. Pt takes on Friday.   Caution:Chemotherapy. Protect from light.     multivitamin with minerals Tabs tablet  Take 1 tablet by mouth daily.     predniSONE 5 MG tablet  Commonly known as:  DELTASONE  Take 1 tablet (5 mg total) by mouth daily with breakfast.        Relevant Imaging Results:  Relevant Lab Results:  Recent Labs    Additional Information  (SSN: [id number])  Haig Prophet, LCSW

## 2014-11-19 NOTE — Progress Notes (Signed)
SNF and Non-Emergent EMS Transport Benefits:  Number called: [phone] Rep: Junie Panning Reference Number: [id number]  [employer] Medicare Advantage PPO plan active since of 01/17/13.  $290 deductible, $210.82 met so far.  Out of pocket max is $3290, of which $210.82 met so far.  In-network SNF: 20% co-insurance per day up to a per day max of $40 for days 1-20 and a 20% co-insurance per day up to a per day max of $160 for days 21-100.  Limited to 100 days each benefit period. $0 for professional fees and a 3 day hospital stay is not required.  Josem Kaufmann is required: [phone].    Non-emergent EMS transport: After $290 deductible is met,  20% co-insurance for each one way medically necessary, Medicare covered trip.  Josem Kaufmann is not required for PPO plans for service codes [phone] or [phone].
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
## 2014-11-19 NOTE — Progress Notes (Addendum)
[facility] - [place] at [facility]   PATIENT NAME: Morgan Patterson    MR#:  [id number]  DATE OF BIRTH:  12-16-1940  SUBJECTIVE:  CHIEF COMPLAINT:   Chief Complaint  Patient presents with  . Fall   patient is 74 year old Caucasian female with history of rheumatoid arthritis and recent left leg  proximal tibia fracture is back to the hospital with complaints of left leg pain after fall on the back porch. She was noted to have displaced left femoral neck fracture. Dr. Pablo Ledger was consulted and is contemplating surgery tomorrow. Patient admits of pain, which is relatively well controlled.   Review of Systems  Constitutional: Negative for fever, chills and weight loss.  HENT: Negative for congestion.   Eyes: Negative for blurred vision and double vision.  Respiratory: Negative for cough, sputum production, shortness of breath and wheezing.   Cardiovascular: Negative for chest pain, palpitations, orthopnea, leg swelling and PND.  Gastrointestinal: Negative for nausea, vomiting, abdominal pain, diarrhea, constipation and blood in stool.  Genitourinary: Negative for dysuria, urgency, frequency and hematuria.  Musculoskeletal: Negative for falls.  Neurological: Negative for dizziness, tremors, focal weakness and headaches.  Endo/Heme/Allergies: Does not bruise/bleed easily.  Psychiatric/Behavioral: Negative for depression. The patient does not have insomnia.     VITAL SIGNS: Blood pressure 119/76, pulse 109, temperature 99 F (37.2 C), temperature source Axillary, resp. rate 20, height 5\' 1"  (1.549 m), weight 54.432 kg (120 lb), SpO2 97 %.  PHYSICAL EXAMINATION:   GENERAL:  74 y.o.-year-old patient lying in the bed with no acute distress.  EYES: Pupils equal, round, reactive to light and accommodation. No scleral icterus. Extraocular muscles intact.  HEENT: Head atraumatic, normocephalic. Oropharynx and nasopharynx clear.  NECK:  Supple, no jugular venous distention.  No thyroid enlargement, no tenderness.  LUNGS: Normal breath sounds bilaterally, no wheezing, rales,rhonchi or crepitation. No use of accessory muscles of respiration.  CARDIOVASCULAR: S1, S2 normal. No murmurs, rubs, or gallops.  ABDOMEN: Soft, nontender, nondistended. Bowel sounds present. No organomegaly or mass.  EXTREMITIES: No pedal edema, cyanosis, or clubbing.  NEUROLOGIC: Cranial nerves II through XII are intact. Muscle strength 5/5 in all extremities. Sensation intact. Gait not checked.  PSYCHIATRIC: The patient is alert and oriented x 3.  SKIN: No obvious rash, lesion, or ulcer.   ORDERS/RESULTS REVIEWED:   CBC  Recent Labs Lab 11/18/14 1807 11/19/14 0544  WBC 7.4 6.1  HGB 10.5* 10.5*  HCT 31.9* 30.9*  PLT 317 303  MCV 99.0 99.2  MCH 32.5 33.7  MCHC 32.8 34.0  RDW 16.3* 16.1*  LYMPHSABS 0.4*  --   MONOABS 0.4  --   EOSABS 0.1  --   BASOSABS 0.1  --    ------------------------------------------------------------------------------------------------------------------  Chemistries   Recent Labs Lab 11/18/14 1807 11/19/14 0544  NA 142 142  K 3.5 3.3*  CL 108 112*  CO2 21* 22  GLUCOSE 99 86  BUN 22* 28*  CREATININE 0.79 0.83  CALCIUM 7.8* 8.2*   ------------------------------------------------------------------------------------------------------------------ estimated creatinine clearance is 44.2 mL/min (by C-G formula based on Cr of 0.83). ------------------------------------------------------------------------------------------------------------------ No results for input(s): TSH, T4TOTAL, T3FREE, THYROIDAB in the last 72 hours.  Invalid input(s): FREET3  Cardiac Enzymes No results for input(s): CKMB, TROPONINI, MYOGLOBIN in the last 168 hours.  Invalid input(s): CK ------------------------------------------------------------------------------------------------------------------ Invalid input(s):  POCBNP ---------------------------------------------------------------------------------------------------------------  RADIOLOGY: Dg Chest 1 View  11/18/2014  CLINICAL DATA:  Pain following fall EXAM: CHEST 1 VIEW COMPARISON:  May 05, 2008 FINDINGS: There is no  edema or consolidation. The heart size and pulmonary vascularity are normal. No adenopathy. There is calcification in the mitral annulus. There are multiple wedge compression fractures throughout the mid and lower thoracic spine. IMPRESSION: Multiple age uncertain compression fractures in the thoracic spine. No edema or consolidation. No pneumothorax. Electronically Signed   By: Bretta Bang III M.D.   On: 11/18/2014 17:51   Dg Knee 1-2 Views Left  11/18/2014  CLINICAL DATA:  74 year old who fell and sustained a lateral tibial plateau and proximal tibial fracture 11/02/2014, who fell again 2 days ago onto the same left knee. Initial encounter for the new fall. EXAM: LEFT KNEE - 1-2 VIEW COMPARISON:  Left knee x-rays and CT 11/02/2014. FINDINGS: Slight further upward displacement of the fracture fragment arising from the anterolateral proximal tibia, attached to the patellar tendon. Calcification within the tendon. No new fractures. Interval improvement in the prepatellar soft tissue swelling. IMPRESSION: No new/ acute fractures. Since the examination 2+ weeks ago, further superior displacement of the fracture fragment arising from the anterolateral proximal tibia which is attached to the patellar tendon. Electronically Signed   By: Hulan Saas M.D.   On: 11/18/2014 20:35   Dg Hip Unilat With Pelvis 2-3 Views Left  11/18/2014  CLINICAL DATA:  Larey Seat Saturday onto buttocks, LEFT hip pain and deformity, has been lying in bed for 3 days, unable to walk EXAM: DG HIP (WITH OR WITHOUT PELVIS) 2-3V LEFT COMPARISON:  None FINDINGS: Diffuse osseous demineralization. Minimal narrowing of RIGHT hip joint. LEFT hip joint and SI joints preserved.  Displaced LEFT femoral neck fracture without dislocation. Osseous pelvis appears intact. Facet degenerative changes at visualized lower lumbar spine. IMPRESSION: Displaced LEFT femoral neck fracture. Osseous demineralization. Electronically Signed   By: Ulyses Southward M.D.   On: 11/18/2014 17:53    EKG:  Orders placed or performed during the hospital encounter of 11/18/14  . ED EKG  . ED EKG  . EKG 12-Lead  . EKG 12-Lead    ASSESSMENT AND PLAN:  Principal Problem:   Hip fracture, left (HCC) Active Problems:   Hip fracture (HCC) 1. Left hip fracture, pain management, physical therapy after procedure, Dr. Truett Perna is planning total hip replacement tomorrow. Echocardiogram revealed severe LVH and severe MR, patient is to continue ACE inhibitor, lisinopril and add low-dose of metoprolol, postprocedure likely as blood pressure remains low 2. Hypokalemia, supplement orally as well as intravenously. Check magnesium level 3. Anemia. Get Hemoccult, follow with rehydration 4. Cardiac murmur due to severe mitral regurgitation, continue ACE inhibitor 5. COPD, stable. Due nebs if needed 6. Proximal left tibial fracture, Dr. Martha Clan is recommending ORIF on Friday, which will help with hip rehabilitation.     Management plans discussed with the patient, family and they are in agreement.   DRUG ALLERGIES:  Allergies  Allergen Reactions  . Penicillins Itching, Swelling and Other (See Comments)    Has patient had a PCN reaction causing immediate rash, facial/tongue/throat swelling, SOB or lightheadedness with hypotension: No Has patient had a PCN reaction causing severe rash involving mucus membranes or skin necrosis: No Has patient had a PCN reaction that required hospitalization No Has patient had a PCN reaction occurring within the last 10 years: No If all of the above answers are "NO", then may proceed with Cephalosporin use.  . Sulfa Antibiotics Itching and Swelling    CODE STATUS:     Code  Status Orders        Start     Ordered  11/18/14 2157  Full code   Continuous     11/18/14 2156      TOTAL TIME TAKING CARE OF THIS PATIENT: . 35 minutes.   Discussed with Dr. Deveron Furlong M.D on 11/19/2014 at 2:20 PM  Between 7am to 6pm - Pager - [phone]  After 6pm go to [email or web address] - password [email or web address] [facility]  [facility]  Office  [phone]  CC: Primary care physician; Kandyce Rud, MD
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
## 2014-11-19 NOTE — Progress Notes (Signed)
*  PRELIMINARY RESULTS* Echocardiogram 2D Echocardiogram has been performed.  Morgan Patterson 11/19/2014, 8:36 AM

## 2014-11-19 NOTE — Progress Notes (Signed)
Admitted from ED with left hip fracture. Alert and oriented. VSS. Pain controlled with meds per MAR. Leg marked. Swab done with results pending. Neurochecks WDL. Scheduled for surgery on Thursday. Consent signed and in chart. Resting quietly. Will continue to monitor.
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
## 2014-11-20 ENCOUNTER — Encounter: Payer: Self-pay | Admitting: Anesthesiology

## 2014-11-20 ENCOUNTER — Inpatient Hospital Stay: Payer: Medicare Other

## 2014-11-20 ENCOUNTER — Inpatient Hospital Stay: Payer: Medicare Other | Admitting: Anesthesiology

## 2014-11-20 ENCOUNTER — Encounter
Admission: EM | Disposition: A | Discharge status: Skilled Nursing Facility | Payer: Self-pay | Source: Home / Self Care | Attending: Internal Medicine

## 2014-11-20 HISTORY — PX: TOTAL HIP ARTHROPLASTY: SHX124

## 2014-11-20 SURGERY — ARTHROPLASTY, HIP, TOTAL, ANTERIOR APPROACH
Anesthesia: Monitor Anesthesia Care | Site: Hip | Laterality: Left | Wound class: Clean

## 2014-11-20 MED ORDER — KETAMINE HCL 50 MG/ML IJ SOLN
INTRAMUSCULAR | Status: DC | PRN
  Administered 2014-11-20: 50 mg via INTRAMUSCULAR

## 2014-11-20 MED ORDER — SODIUM CHLORIDE 0.9 % IV BOLUS (SEPSIS)
500.0000 mL | Freq: Once | INTRAVENOUS | Status: AC
  Administered 2014-11-20: 500 mL via INTRAVENOUS

## 2014-11-20 MED ORDER — PHENYLEPHRINE HCL 10 MG/ML IJ SOLN
INTRAMUSCULAR | Status: DC | PRN
  Administered 2014-11-20 (×3): 100 ug via INTRAVENOUS
  Administered 2014-11-20: 200 ug via INTRAVENOUS
  Administered 2014-11-20 (×3): 100 ug via INTRAVENOUS

## 2014-11-20 MED ORDER — ACETAMINOPHEN 500 MG PO TABS
1000.0000 mg | ORAL_TABLET | Freq: Four times a day (QID) | ORAL | Status: DC
  Administered 2014-11-20 (×3): 1000 mg via ORAL
  Filled 2014-11-20 (×3): qty 2

## 2014-11-20 MED ORDER — FENTANYL CITRATE (PF) 100 MCG/2ML IJ SOLN: 25.0000 ug | INTRAMUSCULAR | Status: DC | PRN

## 2014-11-20 MED ORDER — BUPIVACAINE-EPINEPHRINE 0.25% -1:200000 IJ SOLN
INTRAMUSCULAR | Status: DC | PRN
  Administered 2014-11-20: 30 mL

## 2014-11-20 MED ORDER — SODIUM CHLORIDE 0.9 % IV SOLN
INTRAVENOUS | Status: DC
  Administered 2014-11-20 – 2014-11-21 (×2): via INTRAVENOUS

## 2014-11-20 MED ORDER — EPHEDRINE SULFATE 50 MG/ML IJ SOLN
INTRAMUSCULAR | Status: DC | PRN
Start: 2014-11-20 — End: 2014-11-20
  Administered 2014-11-20: 15 mg via INTRAVENOUS
  Administered 2014-11-20 (×2): 10 mg via INTRAVENOUS
  Administered 2014-11-20: 5 mg via INTRAVENOUS
  Administered 2014-11-20: 10 mg via INTRAVENOUS
  Administered 2014-11-20: 15 mg via INTRAVENOUS

## 2014-11-20 MED ORDER — OXYCODONE HCL 5 MG PO TABS: 5.0000 mg | ORAL_TABLET | Freq: Once | ORAL | Status: DC | PRN

## 2014-11-20 MED ORDER — CLINDAMYCIN PHOSPHATE 600 MG/50ML IV SOLN
600.0000 mg | Freq: Four times a day (QID) | INTRAVENOUS | Status: AC
Start: 2014-11-20 — End: 2014-11-20
  Administered 2014-11-20 (×3): 600 mg via INTRAVENOUS
  Filled 2014-11-20 (×4): qty 50

## 2014-11-20 MED ORDER — CLINDAMYCIN PHOSPHATE 600 MG/50ML IV SOLN
INTRAVENOUS | Status: DC | PRN
  Administered 2014-11-20: 600 mg via INTRAVENOUS

## 2014-11-20 MED ORDER — PHENOL 1.4 % MT LIQD
1.0000 | OROMUCOSAL | Status: DC | PRN
  Filled 2014-11-20: qty 177

## 2014-11-20 MED ORDER — MIDAZOLAM HCL 5 MG/5ML IJ SOLN
INTRAMUSCULAR | Status: DC | PRN
  Administered 2014-11-20: 2 mg via INTRAVENOUS

## 2014-11-20 MED ORDER — PROPOFOL 500 MG/50ML IV EMUL
INTRAVENOUS | Status: DC | PRN
  Administered 2014-11-20: 50 ug/kg/min via INTRAVENOUS

## 2014-11-20 MED ORDER — ALUM & MAG HYDROXIDE-SIMETH 200-200-20 MG/5ML PO SUSP: 30.0000 mL | ORAL | Status: DC | PRN

## 2014-11-20 MED ORDER — OXYCODONE HCL 5 MG PO TABS
5.0000 mg | ORAL_TABLET | ORAL | Status: DC | PRN
  Administered 2014-11-21 – 2014-11-23 (×5): 5 mg via ORAL
  Filled 2014-11-20 (×5): qty 1

## 2014-11-20 MED ORDER — METHYLPREDNISOLONE SODIUM SUCC 125 MG IJ SOLR
60.0000 mg | Freq: Four times a day (QID) | INTRAMUSCULAR | Status: AC
  Administered 2014-11-20 (×2): 60 mg via INTRAVENOUS
  Administered 2014-11-20: 125 mg via INTRAVENOUS
  Filled 2014-11-20 (×2): qty 2

## 2014-11-20 MED ORDER — OXYCODONE HCL 5 MG/5ML PO SOLN: 5.0000 mg | Freq: Once | ORAL | Status: DC | PRN

## 2014-11-20 MED ORDER — NEOMYCIN-POLYMYXIN B GU 40-200000 IR SOLN
Status: DC | PRN
  Administered 2014-11-20: 4 mL

## 2014-11-20 MED ORDER — PROPOFOL 10 MG/ML IV BOLUS
INTRAVENOUS | Status: DC | PRN
  Administered 2014-11-20: 20 mg via INTRAVENOUS

## 2014-11-20 MED ORDER — MENTHOL 3 MG MT LOZG
1.0000 | LOZENGE | OROMUCOSAL | Status: DC | PRN
  Filled 2014-11-20 (×2): qty 9

## 2014-11-20 MED ORDER — DIPHENHYDRAMINE HCL 12.5 MG/5ML PO ELIX
12.5000 mg | ORAL_SOLUTION | ORAL | Status: DC | PRN
  Administered 2014-11-22: 25 mg via ORAL
  Filled 2014-11-20: qty 10

## 2014-11-20 MED ORDER — ACETAMINOPHEN 325 MG PO TABS
650.0000 mg | ORAL_TABLET | Freq: Four times a day (QID) | ORAL | Status: DC | PRN
  Administered 2014-11-20: 650 mg via ORAL
  Filled 2014-11-20 (×2): qty 2

## 2014-11-20 MED ORDER — ONDANSETRON HCL 4 MG/2ML IJ SOLN
4.0000 mg | Freq: Four times a day (QID) | INTRAMUSCULAR | Status: DC | PRN
  Administered 2014-11-21: 4 mg via INTRAVENOUS

## 2014-11-20 MED ORDER — BISACODYL 10 MG RE SUPP: 10.0000 mg | Freq: Every day | RECTAL | Status: DC | PRN

## 2014-11-20 MED ORDER — ACETAMINOPHEN 650 MG RE SUPP: 650.0000 mg | Freq: Four times a day (QID) | RECTAL | Status: DC | PRN

## 2014-11-20 MED ORDER — ONDANSETRON HCL 4 MG PO TABS: 4.0000 mg | ORAL_TABLET | Freq: Four times a day (QID) | ORAL | Status: DC | PRN

## 2014-11-20 MED ORDER — LACTATED RINGERS IV SOLN
INTRAVENOUS | Status: DC | PRN
  Administered 2014-11-20: 07:00:00 via INTRAVENOUS

## 2014-11-20 MED ORDER — GLYCOPYRROLATE 0.2 MG/ML IJ SOLN
INTRAMUSCULAR | Status: DC | PRN
  Administered 2014-11-20: 0.2 mg via INTRAVENOUS

## 2014-11-20 SURGICAL SUPPLY — 43 items
BLADE SAW 1/2 (BLADE) ×3 IMPLANT
BNDG COHESIVE 6X5 TAN STRL LF (GAUZE/BANDAGES/DRESSINGS) ×6 IMPLANT
CANISTER SUCT 1200ML W/VALVE (MISCELLANEOUS) ×3 IMPLANT
CAPT HIP TOTAL 3 ×3 IMPLANT
CATH FOL LEG HOLDER (MISCELLANEOUS) ×3 IMPLANT
CATH TRAY METER 16FR LF (MISCELLANEOUS) ×3 IMPLANT
CHLORAPREP W/TINT 26ML (MISCELLANEOUS) ×3 IMPLANT
DRAPE C-ARM XRAY 36X54 (DRAPES) ×3 IMPLANT
DRAPE INCISE IOBAN 66X60 STRL (DRAPES) IMPLANT
DRAPE POUCH INSTRU U-SHP 10X18 (DRAPES) ×3 IMPLANT
DRAPE SHEET LG 3/4 BI-LAMINATE (DRAPES) ×9 IMPLANT
DRAPE TABLE BACK 80X90 (DRAPES) ×3 IMPLANT
ELECT BLADE 6.5 EXT (BLADE) ×3 IMPLANT
GAUZE SPONGE 4X4 12PLY STRL (GAUZE/BANDAGES/DRESSINGS) ×3 IMPLANT
GLOVE BIOGEL PI IND STRL 9 (GLOVE) ×1 IMPLANT
GLOVE BIOGEL PI INDICATOR 9 (GLOVE) ×2
GLOVE SURG ORTHO 9.0 STRL STRW (GLOVE) ×3 IMPLANT
GOWN SPECIALTY ULTRA XL (MISCELLANEOUS) ×3 IMPLANT
GOWN STRL REUS W/ TWL LRG LVL3 (GOWN DISPOSABLE) ×1 IMPLANT
GOWN STRL REUS W/TWL LRG LVL3 (GOWN DISPOSABLE) ×2
HEMOVAC 400CC 10FR (MISCELLANEOUS) ×3 IMPLANT
HOOD PEEL AWAY FACE SHEILD DIS (HOOD) ×3 IMPLANT
MAT BLUE FLOOR 46X72 FLO (MISCELLANEOUS) ×3 IMPLANT
NDL SAFETY 18GX1.5 (NEEDLE) ×3 IMPLANT
NEEDLE SPNL 18GX3.5 QUINCKE PK (NEEDLE) ×3 IMPLANT
NS IRRIG 1000ML POUR BTL (IV SOLUTION) ×3 IMPLANT
PACK HIP COMPR (MISCELLANEOUS) ×3 IMPLANT
SOL PREP PVP 2OZ (MISCELLANEOUS) ×3
SOLUTION PREP PVP 2OZ (MISCELLANEOUS) ×1 IMPLANT
STAPLER SKIN PROX 35W (STAPLE) ×3 IMPLANT
STRAP SAFETY BODY (MISCELLANEOUS) ×3 IMPLANT
SUT DVC 2 QUILL PDO  T11 36X36 (SUTURE) ×2
SUT DVC 2 QUILL PDO T11 36X36 (SUTURE) ×1 IMPLANT
SUT DVC QUILL MONODERM 30X30 (SUTURE) ×3 IMPLANT
SUT ETHIBOND NAB CT1 #1 30IN (SUTURE) ×3 IMPLANT
SUT SILK 0 (SUTURE) ×2
SUT SILK 0 30XBRD TIE 6 (SUTURE) ×1 IMPLANT
SUT VIC AB 1 CT1 36 (SUTURE) ×3 IMPLANT
SYR 20CC LL (SYRINGE) ×3 IMPLANT
SYR 30ML LL (SYRINGE) ×3 IMPLANT
TAPE MICROFOAM 4IN (TAPE) ×3 IMPLANT
TUBE KAMVAC SUCTION (TUBING) ×3 IMPLANT
WATER STERILE IRR 1000ML POUR (IV SOLUTION) ×3 IMPLANT
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
## 2014-11-20 NOTE — Progress Notes (Signed)
Patient arrived from surgery today for left total hip.  Patient denies any pain following surgery.  Pravena wound vac in place and has no drainage.  Per Dr. Rosita Kea he will probably remove before she discharges to [facility] since she will be here for 4 days most likely as she is requiring a second surgery.  Pravena wound vac battery life only remains for 7 days and must be removed by 7 day mark.  Oralia Rud with [employer] wound can assist with equipment [phone].    Patient scheduled to have second surgery tomorrow on left knee by Dr. Martha Clan.  Plan is for patient to discharge to [facility].

## 2014-11-20 NOTE — Op Note (Signed)
11/18/2014 - 11/20/2014  9:22 AM  PATIENT:  Morgan Patterson  74 y.o. female  PRE-OPERATIVE DIAGNOSIS:  left hip fracture placed femoral neck with inflammatory arthritis   POST-OPERATIVE DIAGNOSIS: Same  PROCEDURE:  Procedure(s): TOTAL HIP ARTHROPLASTY ANTERIOR APPROACH (Left)  SURGEON: Leitha Schuller, MD  ASSISTANTS: None  ANESTHESIA:   spinal  EBL:  Total I/O In: 700 [I.V.:700] Out: 400 [Urine:200; Blood:200]  BLOOD ADMINISTERED:none  DRAINS: none   LOCAL MEDICATIONS USED:  MARCAINE     SPECIMEN:  Source of Specimen:  Left femoral head  DISPOSITION OF SPECIMEN:  PATHOLOGY  COUNTS:  YES  TOURNIQUET:  * No tourniquets in log *  IMPLANTS: Medacta AMIS for standard collared stem, Mpact 52 mm cup DM, with liner and S 28 mm head  DICTATION: .Dragon Dictation   The patient was brought to the operating room and after spinal anesthesia was obtained patient was placed on the operative table with the ipsilateral foot into the Medacta attachment, contralateral leg on a well-padded table. C-arm was brought in and preop template x-ray taken. After prepping and draping in usual sterile fashion appropriate patient identification and timeout procedures were completed. Anterior approach to the hip was obtained and centered over the greater trochanter and TFL muscle. The subcutaneous tissue was incised hemostasis being achieved by electrocautery. TFL fascia was incised and the muscle retracted laterally deep retractor placed. The lateral femoral circumflex vessels were identified and ligated. The anterior capsule was exposed and a capsulotomy performed. The neck fracture was identified and a femoral neck cut revised below the fracture line carried out with a saw. The head was removed without difficulty and showed sclerotic femoral head and acetabulum. Reaming was carried out to 50 mm and a 52 mm cup trial gave appropriate tightness to the acetabular component a 52 DM Mpact cup was impacted into  position. The leg was then externally rotated and ischiofemoral and pubofemoral releases carried out. The femur was sequentially broached to a size 4, size 4 femur and S head trials were placed and the final components chosen. The 4 standard stem was inserted along with a S 28 mm head and 52 mm liner. The hip was reduced and was stable the wound was thoroughly irrigated with a dilute Betadine solution. The deep fascia was closed Using a heavy Quill after infiltration of 30 cc of quarter percent Sensorcaine with epinephrine. TXA was injected into the joint and then the subcutaneous tissue was closed using 2-0 Quill, closing the skin with skin staples Xeroform and honeycomb dressing.   PLAN OF CARE: Continue as inpatient

## 2014-11-20 NOTE — Anesthesia Preprocedure Evaluation (Addendum)
Anesthesia Evaluation  Patient identified by MRN, date of birth, ID band Patient awake    Reviewed: Allergy & Precautions, H&P , NPO status , Patient's Chart, lab work & pertinent test results  History of Anesthesia Complications Negative for: history of anesthetic complications  Airway Mallampati: III  TM Distance: >3 FB Neck ROM: limited    Dental  (+) Teeth Intact, Caps   Pulmonary neg shortness of breath, COPD,    Pulmonary exam normal breath sounds clear to auscultation       Cardiovascular Exercise Tolerance: Good hypertension, (-) angina(-) Past MI and (-) DOE Normal cardiovascular exam+ Valvular Problems/Murmurs MR  Rhythm:regular Rate:Normal     Neuro/Psych negative neurological ROS  negative psych ROS   GI/Hepatic negative GI ROS, Neg liver ROS,   Endo/Other  negative endocrine ROS  Renal/GU negative Renal ROS  negative genitourinary   Musculoskeletal negative musculoskeletal ROS (+) Arthritis ,   Abdominal   Peds negative pediatric ROS (+)  Hematology negative hematology ROS (+)   Anesthesia Other Findings Past Medical History:   Arthritis                                                    Hypertension                                                 COPD (chronic obstructive pulmonary disease) (*             History reviewed. No pertinent surgical history.  BMI    Body Mass Index   22.28 kg/m 2      Reproductive/Obstetrics negative OB ROS                            Anesthesia Physical Anesthesia Plan  ASA: III  Anesthesia Plan: MAC and Spinal   Post-op Pain Management:    Induction:   Airway Management Planned:   Additional Equipment:   Intra-op Plan:   Post-operative Plan:   Informed Consent: I have reviewed the patients History and Physical, chart, labs and discussed the procedure including the risks, benefits and alternatives for the proposed  anesthesia with the patient or authorized representative who has indicated his/her understanding and acceptance.   Dental Advisory Given  Plan Discussed with: Anesthesiologist, CRNA and Surgeon  Anesthesia Plan Comments:         Anesthesia Quick Evaluation

## 2014-11-20 NOTE — Care Management Important Message (Signed)
Important Message  Patient Details  Name: Morgan Patterson MRN: [id number] Date of Birth: 03-17-1940   Medicare Important Message Given:  Yes-second notification given    Collie Siad, RN 11/20/2014, 8:53 AM

## 2014-11-20 NOTE — Transfer of Care (Signed)
Immediate Anesthesia Transfer of Care Note  Patient: Candida Peeling  Procedure(s) Performed: Procedure(s): TOTAL HIP ARTHROPLASTY ANTERIOR APPROACH (Left)  Patient Location: PACU  Anesthesia Type:Spinal  Level of Consciousness: sedated  Airway & Oxygen Therapy: Patient Spontanous Breathing and Patient connected to face mask oxygen  Post-op Assessment: Report given to RN and Post -op Vital signs reviewed and stable  Post vital signs: Reviewed and stable  Last Vitals:  Filed Vitals:   11/20/14 0454  BP: 108/66  Pulse: 106  Temp: 36.9 C  Resp: 18    Complications: No apparent anesthesia complications

## 2014-11-20 NOTE — OR Nursing (Signed)
Dr Rosita Kea at bedside and applied Prevena wound vac.

## 2014-11-20 NOTE — Anesthesia Procedure Notes (Signed)
Spinal Patient location during procedure: OR Start time: 11/20/2014 7:36 AM End time: 11/20/2014 7:40 AM Staffing Resident/CRNA: Nelda Marseille Performed by: resident/CRNA  Preanesthetic Checklist Completed: patient identified, site marked, surgical consent, pre-op evaluation, timeout performed, IV checked, risks and benefits discussed and monitors and equipment checked Spinal Block Patient position: sitting Prep: Betadine Patient monitoring: heart rate, continuous pulse ox, blood pressure and cardiac monitor Approach: midline Location: L4-5 Injection technique: single-shot Needle Needle type: Whitacre and Introducer  Needle gauge: 24 G Needle length: 9 cm Additional Notes Negative paresthesia. Negative blood return. Positive free-flowing CSF. Expiration date of kit checked and confirmed. Patient tolerated procedure well, without complications.

## 2014-11-20 NOTE — Progress Notes (Signed)
Pt. Alert and oriented. VSS. Pain controlled with IV pain meds. NPO since midnight. For surgery this AM. Resting quietly. Will continue to monitor.

## 2014-11-20 NOTE — OR Nursing (Signed)
Updated Dr Randa Ngo regarding spinal level and BP.

## 2014-11-20 NOTE — OR Nursing (Addendum)
Dr Rosita Kea notified regarding reinforcing dressing and drainage thru the reinforcement.  Prevena wound vac ordered by Dr Rosita Kea.  OR notified.

## 2014-11-20 NOTE — Progress Notes (Signed)
PT Cancellation Note  Patient Details Name: Morgan Patterson MRN: [id number] DOB: Aug 04, 1940   Cancelled Treatment:    Reason Eval/Treat Not Completed: Other (comment) (See PT note for further details) Per chart review, pt to have ORIF repair for L tibia on 11/21/14. Secondary to unfixed fx on same-sided THA this date, PT will hold consultation until after the ORIF for clearest picture of current functional status, as well as for safety within the hospital. Nursing in agreement.    Benna Dunks 11/20/2014, 2:22 PM  Benna Dunks, SPT. [phone]

## 2014-11-20 NOTE — Progress Notes (Signed)
[facility] - [place] at [facility]   PATIENT NAME: Morgan Patterson    MR#:  [id number]  DATE OF BIRTH:  Nov 12, 1940  SUBJECTIVE:  CHIEF COMPLAINT:   Chief Complaint  Patient presents with  . Fall   patient is 74 year old Caucasian female with history of rheumatoid arthritis and recent left leg  proximal tibia fracture is back to the hospital with complaints of left leg pain after fall on the back porch. She was noted to have displaced left femoral neck fracture. S/p sx on hip 11/20/14- also have comminuted fracture of tibia- planned sx tomorrow.   Review of Systems  Constitutional: Negative for fever, chills and weight loss.  HENT: Negative for congestion.   Eyes: Negative for blurred vision and double vision.  Respiratory: Negative for cough, sputum production, shortness of breath and wheezing.   Cardiovascular: Negative for chest pain, palpitations, orthopnea, leg swelling and PND.  Gastrointestinal: Negative for nausea, vomiting, abdominal pain, diarrhea, constipation and blood in stool.  Genitourinary: Negative for dysuria, urgency, frequency and hematuria.  Musculoskeletal: Negative for falls.  Neurological: Negative for dizziness, tremors, focal weakness and headaches.  Endo/Heme/Allergies: Does not bruise/bleed easily.  Psychiatric/Behavioral: Negative for depression. The patient does not have insomnia.     VITAL SIGNS: Blood pressure 113/55, pulse 48, temperature 98.1 F (36.7 C), temperature source Oral, resp. rate 16, height 5\' 1"  (1.549 m), weight 53.479 kg (117 lb 14.4 oz), SpO2 92 %.  PHYSICAL EXAMINATION:   GENERAL:  74 y.o.-year-old patient lying in the bed with no acute distress.  EYES: Pupils equal, round, reactive to light and accommodation. No scleral icterus. Extraocular muscles intact.  HEENT: Head atraumatic, normocephalic. Oropharynx and nasopharynx clear.  NECK:  Supple, no jugular venous distention. No thyroid enlargement, no  tenderness.  LUNGS: Normal breath sounds bilaterally, no wheezing, rales,rhonchi or crepitation. No use of accessory muscles of respiration.  CARDIOVASCULAR: S1, S2 normal. No murmurs, rubs, or gallops.  ABDOMEN: Soft, nontender, nondistended. Bowel sounds present. No organomegaly or mass.  EXTREMITIES: No pedal edema, cyanosis, or clubbing. Some pain in left side hip and knee. NEUROLOGIC: Cranial nerves II through XII are intact. Muscle strength 5/5 in all extremities. Sensation intact. Gait not checked.  PSYCHIATRIC: The patient is alert and oriented x 3.  SKIN: No obvious rash, lesion, or ulcer.   ORDERS/RESULTS REVIEWED:   CBC  Recent Labs Lab 11/18/14 1807 11/19/14 0544  WBC 7.4 6.1  HGB 10.5* 10.5*  HCT 31.9* 30.9*  PLT 317 303  MCV 99.0 99.2  MCH 32.5 33.7  MCHC 32.8 34.0  RDW 16.3* 16.1*  LYMPHSABS 0.4*  --   MONOABS 0.4  --   EOSABS 0.1  --   BASOSABS 0.1  --    ------------------------------------------------------------------------------------------------------------------  Chemistries   Recent Labs Lab 11/18/14 1807 11/18/14 2305 11/19/14 0544  NA 142  --  142  K 3.5  --  3.3*  CL 108  --  112*  CO2 21*  --  22  GLUCOSE 99  --  86  BUN 22*  --  28*  CREATININE 0.79  --  0.83  CALCIUM 7.8*  --  8.2*  MG  --  2.2  --    ------------------------------------------------------------------------------------------------------------------ estimated creatinine clearance is 44.2 mL/min (by C-G formula based on Cr of 0.83). ------------------------------------------------------------------------------------------------------------------ No results for input(s): TSH, T4TOTAL, T3FREE, THYROIDAB in the last 72 hours.  Invalid input(s): FREET3  Cardiac Enzymes No results for input(s): CKMB, TROPONINI, MYOGLOBIN in the last  168 hours.  Invalid input(s):  CK ------------------------------------------------------------------------------------------------------------------ Invalid input(s): POCBNP ---------------------------------------------------------------------------------------------------------------  RADIOLOGY: Dg Knee 1-2 Views Left  11/18/2014  CLINICAL DATA:  74 year old who fell and sustained a lateral tibial plateau and proximal tibial fracture 11/02/2014, who fell again 2 days ago onto the same left knee. Initial encounter for the new fall. EXAM: LEFT KNEE - 1-2 VIEW COMPARISON:  Left knee x-rays and CT 11/02/2014. FINDINGS: Slight further upward displacement of the fracture fragment arising from the anterolateral proximal tibia, attached to the patellar tendon. Calcification within the tendon. No new fractures. Interval improvement in the prepatellar soft tissue swelling. IMPRESSION: No new/ acute fractures. Since the examination 2+ weeks ago, further superior displacement of the fracture fragment arising from the anterolateral proximal tibia which is attached to the patellar tendon. Electronically Signed   By: Hulan Saas M.D.   On: 11/18/2014 20:35   Dg Hip Operative Unilat With Pelvis Left  11/20/2014  CLINICAL DATA:  Status post total hip replacement EXAM: OPERATIVE LEFT HIP  1 VIEW TECHNIQUE: Fluoroscopic spot image(s) were submitted for interpretation post-operatively. FLUOROSCOPY TIME:  0 MINUTES 28 SECONDS; 2 SUBMITTED IMAGES COMPARISON:  None. FINDINGS: Frontal view shows total hip replacement with prosthetic components appearing well-seated. A small bony fragment is noted just superior to the intertrochanteric region medially. No dislocation apparent on this single view. IMPRESSION: Total hip prosthesis on the left appears well-seated. Small bony fragment noted medial and slightly superior to the intertrochanteric region on the left. No demonstrable dislocation. Electronically Signed   By: Bretta Bang III M.D.   On:  11/20/2014 09:12   Dg Hip Unilat W Or W/o Pelvis 2-3 Views Left  11/20/2014  CLINICAL DATA:  Hip fracture. EXAM: DG HIP (WITH OR WITHOUT PELVIS) 2-3V LEFT COMPARISON:  11/18/2014 FINDINGS: Left hip replacement has been performed in the interval with satisfactory position and alignment. No complication identified. IMPRESSION: Satisfactory left hip replacement. Electronically Signed   By: Marlan Palau M.D.   On: 11/20/2014 09:57    EKG:  Orders placed or performed during the hospital encounter of 11/18/14  . ED EKG  . ED EKG  . EKG 12-Lead  . EKG 12-Lead    ASSESSMENT AND PLAN:  Principal Problem:   Hip fracture, left (HCC) Active Problems:   Hip fracture (HCC) 1. Left hip fracture,  S/p sx 11/20/14. pain management, physical therapy . Echocardiogram revealed severe LVH and severe MR, patient is to continue ACE inhibitor, lisinopril and add low-dose of metoprolol, 2. Hypokalemia, supplement orally as well as intravenously. normal magnesium level 3. Anemia. Get Hemoccult, follow with rehydration 4. Cardiac murmur due to severe mitral regurgitation, continue ACE inhibitor 5. COPD, stable. Due nebs if needed 6. Proximal left tibial fracture, Dr. Martha Clan is recommending ORIF on Friday, which will help with hip rehabilitation.     Management plans discussed with the patient,social worker and they are in agreement.   DRUG ALLERGIES:  Allergies  Allergen Reactions  . Penicillins Itching, Swelling and Other (See Comments)    Has patient had a PCN reaction causing immediate rash, facial/tongue/throat swelling, SOB or lightheadedness with hypotension: No Has patient had a PCN reaction causing severe rash involving mucus membranes or skin necrosis: No Has patient had a PCN reaction that required hospitalization No Has patient had a PCN reaction occurring within the last 10 years: No If all of the above answers are "NO", then may proceed with Cephalosporin use.  . Sulfa Antibiotics  Itching and Swelling  CODE STATUS:     Code Status Orders        Start     Ordered   11/18/14 2157  Full code   Continuous     11/18/14 2156      TOTAL TIME TAKING CARE OF THIS PATIENT: . 35 minutes.   Discussed with Dr. Carolynn Serve, Heath Gold M.D on 11/20/2014 at 6:29 PM  Between 7am to 6pm - Pager - [phone]  After 6pm go to [email or web address] - password [email or web address] [facility]  [place] [facility]  Office  [phone]  CC: Primary care physician; Kandyce Rud, MD

## 2014-11-20 NOTE — Plan of Care (Signed)
Problem: Activity: Goal: Ability to ambulate and perform ADLs will improve Outcome: Not Applicable Date Met:  87/56/43 Left total hip surgery completed 11/20/2014, see new care plan  Problem: Education: Goal: Verbalization of understanding the information provided (i.e., activity precautions, restrictions, etc) will improve Outcome: Not Applicable Date Met:  32/95/18 Left Total Hip surgery 11/20/2014 completed, see new care plan  Problem: Nutrition: Goal: Ability to attain and maintain optimal nutritional status will improve Outcome: Not Applicable Date Met:  84/16/60 Left total hip surgery 11/20/2014 completed, see new care plan  Problem: Physical Regulation: Goal: Will remain free from infection Outcome: Not Applicable Date Met:  63/01/60 LEft total hip surgery completed 11/19/2104 see new care plan

## 2014-11-21 ENCOUNTER — Encounter: Payer: Self-pay | Admitting: Anesthesiology

## 2014-11-21 ENCOUNTER — Inpatient Hospital Stay: Payer: Medicare Other | Admitting: Anesthesiology

## 2014-11-21 ENCOUNTER — Encounter
Admission: RE | Admit: 2014-11-21 | Discharge: 2014-11-21 | Disposition: A | Discharge status: Home / Self Care | Payer: Medicare Other | Source: Ambulatory Visit | Attending: Internal Medicine | Admitting: Internal Medicine

## 2014-11-21 ENCOUNTER — Encounter
Admission: EM | Disposition: A | Discharge status: Skilled Nursing Facility | Payer: Self-pay | Source: Home / Self Care | Attending: Internal Medicine

## 2014-11-21 ENCOUNTER — Inpatient Hospital Stay: Payer: Medicare Other

## 2014-11-21 DIAGNOSIS — D649 Anemia, unspecified: Secondary | ICD-10-CM | POA: Insufficient documentation

## 2014-11-21 DIAGNOSIS — E876 Hypokalemia: Secondary | ICD-10-CM | POA: Insufficient documentation

## 2014-11-21 DIAGNOSIS — J449 Chronic obstructive pulmonary disease, unspecified: Secondary | ICD-10-CM | POA: Insufficient documentation

## 2014-11-21 DIAGNOSIS — I10 Essential (primary) hypertension: Secondary | ICD-10-CM | POA: Insufficient documentation

## 2014-11-21 HISTORY — PX: ORIF TIBIA FRACTURE: SHX5416

## 2014-11-21 LAB — CBC
HCT: 25.6 % — ABNORMAL LOW (ref 35.0–47.0)
Hemoglobin: 8.5 g/dL — ABNORMAL LOW (ref 12.0–16.0)
MCH: 33.1 pg (ref 26.0–34.0)
MCHC: 33.1 g/dL (ref 32.0–36.0)
MCV: 99.9 fL (ref 80.0–100.0)
Platelets: 359 10*3/uL (ref 150–440)
RBC: 2.57 MIL/uL — ABNORMAL LOW (ref 3.80–5.20)
RDW: 17.6 % — AB (ref 11.5–14.5)
WBC: 8 10*3/uL (ref 3.6–11.0)

## 2014-11-21 LAB — BASIC METABOLIC PANEL
ANION GAP: 6 (ref 5–15)
BUN: 28 mg/dL — AB (ref 6–20)
CALCIUM: 7.8 mg/dL — AB (ref 8.9–10.3)
CO2: 22 mmol/L (ref 22–32)
Chloride: 110 mmol/L (ref 101–111)
Creatinine, Ser: 0.72 mg/dL (ref 0.44–1.00)
GFR calc Af Amer: >60 mL/min (ref >60)
GLUCOSE: 133 mg/dL — AB (ref 65–99)
Potassium: 4.2 mmol/L (ref 3.5–5.1)
SODIUM: 138 mmol/L (ref 135–145)

## 2014-11-21 SURGERY — OPEN REDUCTION INTERNAL FIXATION (ORIF) TIBIA FRACTURE
Anesthesia: Spinal | Laterality: Left | Wound class: Clean

## 2014-11-21 MED ORDER — LIDOCAINE HCL (CARDIAC) 20 MG/ML IV SOLN
INTRAVENOUS | Status: DC | PRN
  Administered 2014-11-21: 60 mg via INTRAVENOUS

## 2014-11-21 MED ORDER — MIDAZOLAM HCL 5 MG/5ML IJ SOLN
INTRAMUSCULAR | Status: DC | PRN
  Administered 2014-11-21 (×2): 1 mg via INTRAVENOUS

## 2014-11-21 MED ORDER — NEOMYCIN-POLYMYXIN B GU 40-200000 IR SOLN
Status: DC | PRN
  Administered 2014-11-21: 4 mL

## 2014-11-21 MED ORDER — ONDANSETRON HCL 4 MG/2ML IJ SOLN: 4.0000 mg | Freq: Once | INTRAMUSCULAR | Status: DC | PRN

## 2014-11-21 MED ORDER — FERROUS SULFATE 325 (65 FE) MG PO TABS: 325.0000 mg | ORAL_TABLET | Freq: Two times a day (BID) | ORAL | Status: AC

## 2014-11-21 MED ORDER — FENTANYL CITRATE (PF) 100 MCG/2ML IJ SOLN
INTRAMUSCULAR | Status: DC | PRN
  Administered 2014-11-21: 25 ug via INTRAVENOUS

## 2014-11-21 MED ORDER — FENTANYL CITRATE (PF) 100 MCG/2ML IJ SOLN: 25.0000 ug | INTRAMUSCULAR | Status: DC | PRN

## 2014-11-21 MED ORDER — ENSURE ENLIVE PO LIQD: 237.0000 mL | Freq: Three times a day (TID) | ORAL | Status: AC

## 2014-11-21 MED ORDER — PROPOFOL 500 MG/50ML IV EMUL
INTRAVENOUS | Status: DC | PRN
  Administered 2014-11-21: 50 ug/kg/min via INTRAVENOUS

## 2014-11-21 MED ORDER — POLYETHYLENE GLYCOL 3350 17 G PO PACK: 17.0000 g | PACK | Freq: Every day | ORAL | Status: DC | PRN

## 2014-11-21 MED ORDER — PROPOFOL 10 MG/ML IV BOLUS
INTRAVENOUS | Status: DC | PRN
  Administered 2014-11-21: 30 mg via INTRAVENOUS

## 2014-11-21 MED ORDER — BISACODYL 10 MG RE SUPP: 10.0000 mg | Freq: Every day | RECTAL | Status: AC | PRN

## 2014-11-21 MED ORDER — CLINDAMYCIN PHOSPHATE 600 MG/50ML IV SOLN
INTRAVENOUS | Status: DC | PRN
  Administered 2014-11-21: 600 mg via INTRAVENOUS

## 2014-11-21 MED ORDER — OXYCODONE HCL 5 MG PO TABS: 5.0000 mg | ORAL_TABLET | ORAL | Status: AC | PRN

## 2014-11-21 MED ORDER — LACTATED RINGERS IV SOLN
INTRAVENOUS | Status: DC | PRN
  Administered 2014-11-21: 13:00:00 via INTRAVENOUS

## 2014-11-21 MED ORDER — SODIUM CHLORIDE 0.9 % IV SOLN: 10000.0000 ug | INTRAVENOUS | Status: DC | PRN

## 2014-11-21 MED ORDER — SODIUM CHLORIDE 0.9 % IV SOLN
75.0000 mL/h | INTRAVENOUS | Status: DC
  Administered 2014-11-21 – 2014-11-23 (×3): 75 mL/h via INTRAVENOUS

## 2014-11-21 MED ORDER — DOCUSATE SODIUM 100 MG PO CAPS: 100.0000 mg | ORAL_CAPSULE | Freq: Two times a day (BID) | ORAL | Status: AC

## 2014-11-21 MED ORDER — CLINDAMYCIN PHOSPHATE 600 MG/50ML IV SOLN
600.0000 mg | Freq: Four times a day (QID) | INTRAVENOUS | Status: AC
  Administered 2014-11-21 – 2014-11-22 (×2): 600 mg via INTRAVENOUS
  Filled 2014-11-21 (×2): qty 50

## 2014-11-21 MED ORDER — ENOXAPARIN SODIUM 30 MG/0.3ML ~~LOC~~ SOLN
30.0000 mg | Freq: Two times a day (BID) | SUBCUTANEOUS | Status: DC
  Administered 2014-11-22 – 2014-11-23 (×3): 30 mg via SUBCUTANEOUS
  Filled 2014-11-21 (×3): qty 0.3

## 2014-11-21 MED ORDER — PHENYLEPHRINE HCL 10 MG/ML IJ SOLN
INTRAMUSCULAR | Status: DC | PRN
  Administered 2014-11-21 (×2): 100 ug via INTRAVENOUS

## 2014-11-21 SURGICAL SUPPLY — 48 items
ANCHOR SUPER #2 ORTHOCORD (MISCELLANEOUS) ×6 IMPLANT
BIT DRILL CANN 2.7X625 NONSTRL (BIT) ×3 IMPLANT
BIT DRILL CANN LRG QC 5X300 (BIT) ×3 IMPLANT
BNDG ESMARK 6X12 TAN STRL LF (GAUZE/BANDAGES/DRESSINGS) ×3 IMPLANT
BRACE KNEE POST OP SHORT (BRACE) ×3 IMPLANT
CANISTER SUCT 1200ML W/VALVE (MISCELLANEOUS) ×3 IMPLANT
CATH TRAY 16F METER LATEX (MISCELLANEOUS) ×3 IMPLANT
DRAPE C-ARM XRAY 36X54 (DRAPES) ×3 IMPLANT
DRAPE INCISE IOBAN 66X45 STRL (DRAPES) ×3 IMPLANT
DRAPE INCISE IOBAN 66X60 STRL (DRAPES) ×3 IMPLANT
DRAPE SHEET LG 3/4 BI-LAMINATE (DRAPES) ×3 IMPLANT
DRSG EMULSION OIL 3X8 NADH (GAUZE/BANDAGES/DRESSINGS) ×3 IMPLANT
DURAPREP 26ML APPLICATOR (WOUND CARE) ×9 IMPLANT
ELECT CAUTERY BLADE 6.4 (BLADE) ×3 IMPLANT
GAUZE SPONGE 4X4 12PLY STRL (GAUZE/BANDAGES/DRESSINGS) ×3 IMPLANT
GLOVE BIOGEL PI IND STRL 9 (GLOVE) ×1 IMPLANT
GLOVE BIOGEL PI INDICATOR 9 (GLOVE) ×2
GLOVE SURG 9.0 ORTHO LTXF (GLOVE) ×6 IMPLANT
GOWN STRL REUS W/ TWL LRG LVL3 (GOWN DISPOSABLE) ×1 IMPLANT
GOWN STRL REUS W/ TWL XL LVL3 (GOWN DISPOSABLE) ×1 IMPLANT
GOWN STRL REUS W/TWL 2XL LVL3 (GOWN DISPOSABLE) ×3 IMPLANT
GOWN STRL REUS W/TWL LRG LVL3 (GOWN DISPOSABLE) ×2
GOWN STRL REUS W/TWL XL LVL3 (GOWN DISPOSABLE) ×2
GOWN STRL REUS W/TWL XL LVL4 (GOWN DISPOSABLE) ×3 IMPLANT
GUIDEWARE NON THREAD 1.25X150 (WIRE) ×6
GUIDEWIRE NON THREAD 1.25X150 (WIRE) ×2 IMPLANT
GUIDEWIRE THREADED 2.8MM (WIRE) ×3 IMPLANT
HANDLE YANKAUER SUCT BULB TIP (MISCELLANEOUS) ×3 IMPLANT
HEMOVAC 400CC 10FR (MISCELLANEOUS) ×3 IMPLANT
KIT RM TURNOVER STRD PROC AR (KITS) ×3 IMPLANT
NEEDLE FILTER BLUNT 18X 1/2SAF (NEEDLE) ×2
NEEDLE FILTER BLUNT 18X1 1/2 (NEEDLE) ×1 IMPLANT
NS IRRIG 1000ML POUR BTL (IV SOLUTION) ×3 IMPLANT
PACK TOTAL KNEE (MISCELLANEOUS) ×3 IMPLANT
PAD GROUND ADULT SPLIT (MISCELLANEOUS) ×3 IMPLANT
PAD PREP 24X41 OB/GYN DISP (PERSONAL CARE ITEMS) ×3 IMPLANT
SCREW CANN L THRD/44 4.0 (Screw) ×3 IMPLANT
SPONGE LAP 18X18 5 PK (GAUZE/BANDAGES/DRESSINGS) ×3 IMPLANT
STAPLER SKIN PROX 35W (STAPLE) ×3 IMPLANT
SUT ETHIBOND NAB CT1 #1 30IN (SUTURE) ×3 IMPLANT
SUT VIC AB 2-0 SH 27 (SUTURE) ×2
SUT VIC AB 2-0 SH 27XBRD (SUTURE) ×1 IMPLANT
SUT VICRYL 1-0 27IN ABS (SUTURE) ×3
SUTURE VICRYL 1-0 27IN ABS (SUTURE) ×1 IMPLANT
SYR 5ML LL (SYRINGE) ×3 IMPLANT
Super QuickAnchor PLus Ds ×4 IMPLANT
WASHER 7MM DIA (Washer) ×3 IMPLANT
WASHER FOR 5.0 SCREWS (Washer) ×3 IMPLANT

## 2014-11-21 NOTE — Transfer of Care (Signed)
Immediate Anesthesia Transfer of Care Note  Patient: Morgan Patterson  Procedure(s) Performed: Procedure(s): OPEN REDUCTION INTERNAL FIXATION (ORIF) TIBIA FRACTURE (Left)  Patient Location: PACU  Anesthesia Type:Spinal  Level of Consciousness: awake, alert , oriented and patient cooperative  Airway & Oxygen Therapy: Patient Spontanous Breathing and Patient connected to face mask oxygen  Post-op Assessment: Report given to RN, Post -op Vital signs reviewed and stable and Patient moving all extremities X 4  Post vital signs: Reviewed and stable  Last Vitals:  Filed Vitals:   11/21/14 0750  BP: 130/52  Pulse: 102  Temp: 36.9 C  Resp: 20    Complications: No apparent anesthesia complications

## 2014-11-21 NOTE — Progress Notes (Signed)
   11/21/14 1000  Clinical Encounter Type  Visited With Patient  Visit Type Follow-up  Consult/Referral To Chaplain  Stress Factors  Patient Stress Factors Health changes  Chaplain followed with patient and she appeared to be in better spirits. She appeared to be pleased with her surgery and the care she has received thus far.   Chaplain Maranatha Grossi [phone]

## 2014-11-21 NOTE — Progress Notes (Signed)
POD 1 of hip replacement. NPO for ORIF of femur. Restless night. Foley patent. Pt. Pulled off wound vac. Replaced with coverderm. No new dressing for wound vac in hospital per nursing supervisor. VSS. Neurochecks WDL. Resting quietly

## 2014-11-21 NOTE — Anesthesia Procedure Notes (Signed)
Spinal  Start time: 11/21/2014 1:24 PM End time: 11/21/2014 1:29 PM Staffing Anesthesiologist: Yves Dill Performed by: anesthesiologist  Preanesthetic Checklist Completed: patient identified, site marked, surgical consent, pre-op evaluation, timeout performed, IV checked, risks and benefits discussed and monitors and equipment checked Spinal Block Patient position: sitting Prep: Betadine and site prepped and draped Patient monitoring: heart rate, cardiac monitor, continuous pulse ox and blood pressure Approach: midline Location: L3-4 Injection technique: single-shot Needle Needle type: Whitacre  Needle gauge: 25 G Assessment Sensory level: T8 Additional Notes Time out called.  Patient placed in sitting position.  Prepped and draped in sterile fashion and spinal as above.  Clear return of CSF in all 4 quadrants.
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
## 2014-11-21 NOTE — Progress Notes (Signed)
OT Cancellation Note  Patient Details Name: Morgan Patterson MRN: [id number] DOB: 11-13-1940   Cancelled Treatment:    Reason Eval/Treat Not Completed: Other (comment). Patient to have a second surgery today. Will hold Occupational Therapy for now.  Ocie Cornfield 11/21/2014, 9:25 AM

## 2014-11-21 NOTE — Plan of Care (Signed)
Problem: Activity: Goal: Ability to tolerate increased activity will improve Outcome: Not Progressing Bedrest pending knee surgery

## 2014-11-21 NOTE — Progress Notes (Signed)
Subjective:  Patient with left proximal tibia fracture including tibial tubercle avulsion. Patient reports pain as mild.  She is postop day 1 status post left hip arthroplasty for femoral neck fracture.  Objective:   VITALS:   Filed Vitals:   11/20/14 1642 11/20/14 2039 11/21/14 0521 11/21/14 0750  BP: 113/55 128/58 131/75 130/52  Pulse: 48 104 102 102  Temp:  98.3 F (36.8 C) 98.5 F (36.9 C) 98.5 F (36.9 C)  TempSrc:  Oral  Oral  Resp: 16 19 18 20   Height:      Weight:      SpO2: 92% 100% 99% 100%    PHYSICAL EXAM:  Left knee/lower extremity: Patient's skin is intact. She has tenderness over the proximal tibia anteriorly. Distally she is neurovascular intact. There is ecchymosis and moderate swelling in the left lower leg and foot. She has intact sensation light touch and what is well-perfused.   LABS  Results for orders placed or performed during the hospital encounter of 11/18/14 (from the past 24 hour(s))  Basic metabolic panel     Status: Abnormal   Collection Time: 11/21/14  5:08 AM  Result Value Ref Range   Sodium 138 135 - 145 mmol/L   Potassium 4.2 3.5 - 5.1 mmol/L   Chloride 110 101 - 111 mmol/L   CO2 22 22 - 32 mmol/L   Glucose, Bld 133 (H) 65 - 99 mg/dL   BUN 28 (H) 6 - 20 mg/dL   Creatinine, Ser 13/04/16 0.44 - 1.00 mg/dL   Calcium 7.8 (L) 8.9 - 10.3 mg/dL   GFR calc non Af Amer >60 >60 mL/min   GFR calc Af Amer >60 >60 mL/min   Anion gap 6 5 - 15  CBC     Status: Abnormal   Collection Time: 11/21/14  5:08 AM  Result Value Ref Range   WBC 8.0 3.6 - 11.0 K/uL   RBC 2.57 (L) 3.80 - 5.20 MIL/uL   Hemoglobin 8.5 (L) 12.0 - 16.0 g/dL   HCT 13/04/16 (L) 22.6 - 33.3 %   MCV 99.9 80.0 - 100.0 fL   MCH 33.1 26.0 - 34.0 pg   MCHC 33.1 32.0 - 36.0 g/dL   RDW 54.5 (H) 62.5 - 63.8 %   Platelets 359 150 - 440 K/uL    Dg Hip Operative Unilat With Pelvis Left  11/20/2014  CLINICAL DATA:  Status post total hip replacement EXAM: OPERATIVE LEFT HIP  1 VIEW TECHNIQUE:  Fluoroscopic spot image(s) were submitted for interpretation post-operatively. FLUOROSCOPY TIME:  0 MINUTES 28 SECONDS; 2 SUBMITTED IMAGES COMPARISON:  None. FINDINGS: Frontal view shows total hip replacement with prosthetic components appearing well-seated. A small bony fragment is noted just superior to the intertrochanteric region medially. No dislocation apparent on this single view. IMPRESSION: Total hip prosthesis on the left appears well-seated. Small bony fragment noted medial and slightly superior to the intertrochanteric region on the left. No demonstrable dislocation. Electronically Signed   By: 13/03/2014 III M.D.   On: 11/20/2014 09:12   Dg Hip Unilat W Or W/o Pelvis 2-3 Views Left  11/20/2014  CLINICAL DATA:  Hip fracture. EXAM: DG HIP (WITH OR WITHOUT PELVIS) 2-3V LEFT COMPARISON:  11/18/2014 FINDINGS: Left hip replacement has been performed in the interval with satisfactory position and alignment. No complication identified. IMPRESSION: Satisfactory left hip replacement. Electronically Signed   By: 13/01/2014 M.D.   On: 11/20/2014 09:57    Assessment/Plan: Day of Surgery   Principal Problem:  Hip fracture, left [facility]) Active Problems:   Hip fracture [facility])  Patient understands the plan is for open reduction internal fixation of her fracture and repair of the patella tendon as required. I reviewed the risks and benefits of surgery with her at the time of her admission. She is in agreement with plan for surgery.    Juanell Fairly , MD 11/21/2014, 12:40 PM

## 2014-11-21 NOTE — Progress Notes (Signed)
Pt. Pulled her wound vac dressing off at around 2200 last PM. Alesha, nursing supervisor made aware and reported back we have no dressings for this wound vac in the hospital. Will cover wound with coverderm for the time being and report to nurse in the AM report.

## 2014-11-21 NOTE — Progress Notes (Signed)
Patient cooperative with care this shift as she waited on surgery for left knee.  Patient was attempting to remove ted hose with pen and brush and when approached to take pen away from her she pulled back and reacted very strongly.  Patient later apologized.  Patient denies any pain this shift prior to surgery.  Cranston Neighbor, PA to come back this afternoon to place new [place]a would vac.

## 2014-11-21 NOTE — Progress Notes (Signed)
  Subjective: 1 Day Post-Op Procedure(s) (LRB): TOTAL HIP ARTHROPLASTY ANTERIOR APPROACH (Left) Patient reports pain as mild.   Patient is well, and has had no acute complaints or problems Plan is to go Skilled nursing facility after hospital stay. Negative for chest pain and shortness of breath Fever: no Gastrointestinal:Negative for nausea and vomiting  Objective: Vital signs in last 24 hours: Temp:  [97.6 F (36.4 C)-98.5 F (36.9 C)] 98.5 F (36.9 C) (11/04 0750) Pulse Rate:  [48-114] 102 (11/04 0750) Resp:  [11-20] 20 (11/04 0750) BP: (69-131)/(37-75) 130/52 mmHg (11/04 0750) SpO2:  [92 %-100 %] 100 % (11/04 0750)  Intake/Output from previous day:  Intake/Output Summary (Last 24 hours) at 11/21/14 0754 Last data filed at 11/21/14 0600  Gross per 24 hour  Intake   3970 ml  Output   1175 ml  Net   2795 ml    Intake/Output this shift:    Labs:  Recent Labs  11/18/14 1807 11/19/14 0544 11/21/14 0508  HGB 10.5* 10.5* 8.5*    Recent Labs  11/19/14 0544 11/21/14 0508  WBC 6.1 8.0  RBC 3.12* 2.57*  HCT 30.9* 25.6*  PLT 303 359    Recent Labs  11/19/14 0544 11/21/14 0508  NA 142 138  K 3.3* 4.2  CL 112* 110  CO2 22 22  BUN 28* 28*  CREATININE 0.83 0.72  GLUCOSE 86 133*  CALCIUM 8.2* 7.8*    Recent Labs  11/18/14 1807  INR 1.19     EXAM General - Patient is Alert and Appropriate Extremity - ABD soft Neurovascular intact Sensation intact distally Intact pulses distally Incision: dressing C/D/I No cellulitis present Dressing/Incision - clean, dry, no drainage Motor Function - intact, moving foot and toes well on exam.   Abdomen soft on exam.  No distention or tympany.  Past Medical History  Diagnosis Date  . Arthritis   . Hypertension   . COPD (chronic obstructive pulmonary disease) (HCC)     Assessment/Plan: 1 Day Post-Op Procedure(s) (LRB): TOTAL HIP ARTHROPLASTY ANTERIOR APPROACH (Left) Principal Problem:   Hip fracture,  left (HCC) Active Problems:   Hip fracture (HCC)  Estimated body mass index is 22.29 kg/(m^2) as calculated from the following:   Height as of this encounter: 5\' 1"  (1.549 m).   Weight as of this encounter: 53.479 kg (117 lb 14.4 oz).  Keep NPO.  Pt going to surgery for left knee with Dr. later today.  She will undergo a ORIF of left anterior proximal tibia fracture. Pt pulled wound-vac off during night.  Wound covered with ABD's.  Spoke with RN about contacting Rep to have another placed after she returns from the OR today. Labs reviewed.  Hg 8.5 this AM.  Pt is asymptomatic.  DVT Prophylaxis - Foot Pumps and TED hose, Pt going to surgery on 11/21/14 with Dr. 13/4/16. Weight-Bearing as tolerated to left leg  J. Martha Clan, PA-C [facility] 11/21/2014, 7:54 AM

## 2014-11-21 NOTE — Anesthesia Preprocedure Evaluation (Addendum)
Anesthesia Evaluation  Patient identified by MRN, date of birth, ID band Patient awake    Reviewed: Allergy & Precautions, NPO status , Patient's Chart, lab work & pertinent test results  Airway Mallampati: II  TM Distance: <3 FB Neck ROM: Limited    Dental  (+) Chipped, Caps   Pulmonary COPD,  COPD inhaler,    Pulmonary exam normal breath sounds clear to auscultation       Cardiovascular hypertension, Pt. on medications Normal cardiovascular exam     Neuro/Psych negative neurological ROS  negative psych ROS   GI/Hepatic negative GI ROS, Neg liver ROS,   Endo/Other  negative endocrine ROS  Renal/GU negative Renal ROS  negative genitourinary   Musculoskeletal  (+) Arthritis , Rheumatoid disorders,    Abdominal Normal abdominal exam  (+)   Peds negative pediatric ROS (+)  Hematology negative hematology ROS (+)   Anesthesia Other Findings   Reproductive/Obstetrics                           Anesthesia Physical Anesthesia Plan  ASA: III  Anesthesia Plan: Spinal   Post-op Pain Management:    Induction:   Airway Management Planned: Nasal Cannula  Additional Equipment:   Intra-op Plan:   Post-operative Plan:   Informed Consent: I have reviewed the patients History and Physical, chart, labs and discussed the procedure including the risks, benefits and alternatives for the proposed anesthesia with the patient or authorized representative who has indicated his/her understanding and acceptance.   Dental advisory given  Plan Discussed with: CRNA and Surgeon  Anesthesia Plan Comments:         Anesthesia Quick Evaluation

## 2014-11-21 NOTE — Anesthesia Postprocedure Evaluation (Signed)
  Anesthesia Post-op Note  Patient: Morgan Patterson  Procedure(s) Performed: Procedure(s): TOTAL HIP ARTHROPLASTY ANTERIOR APPROACH (Left)  Anesthesia type:MAC, Spinal  Patient location: Floor  Post pain: Pain level controlled  Post assessment: Post-op Vital signs reviewed, Patient's Cardiovascular Status Stable, Respiratory Function Stable, Patent Airway and No signs of Nausea or vomiting  Post vital signs: Reviewed and stable  Last Vitals:  Filed Vitals:   11/21/14 0750  BP: 130/52  Pulse: 102  Temp: 36.9 C  Resp: 20    Level of consciousness: awake, alert  and patient cooperative  Complications: No apparent anesthesia complications

## 2014-11-21 NOTE — Progress Notes (Signed)
[facility] - [place] at [facility]   PATIENT NAME: Morgan Patterson    MR#:  [id number]  DATE OF BIRTH:  [birth year]  SUBJECTIVE:  CHIEF COMPLAINT:   Chief Complaint  Patient presents with  . Fall   patient is 74 year old Caucasian female with history of rheumatoid arthritis and recent left leg  proximal tibia fracture is back to the hospital with complaints of left leg pain after fall on the back porch. She was noted to have displaced left femoral neck fracture. S/p sx on hip 11/20/14- also have comminuted fracture of tibia- for surgery today- 11/21/14.   Review of Systems  Constitutional: Negative for fever, chills and weight loss.  HENT: Negative for congestion.   Eyes: Negative for blurred vision and double vision.  Respiratory: Negative for cough, sputum production, shortness of breath and wheezing.   Cardiovascular: Negative for chest pain, palpitations, orthopnea, leg swelling and PND.  Gastrointestinal: Negative for nausea, vomiting, abdominal pain, diarrhea, constipation and blood in stool.  Genitourinary: Negative for dysuria, urgency, frequency and hematuria.  Musculoskeletal: Negative for falls.  Neurological: Negative for dizziness, tremors, focal weakness and headaches.  Endo/Heme/Allergies: Does not bruise/bleed easily.  Psychiatric/Behavioral: Negative for depression. The patient does not have insomnia.     VITAL SIGNS: Blood pressure 122/48, pulse 88, temperature 98.7 F (37.1 C), temperature source Oral, resp. rate 18, height 5\' 1"  (1.549 m), weight 53.479 kg (117 lb 14.4 oz), SpO2 100 %.  PHYSICAL EXAMINATION:   GENERAL:  74 y.o.-year-old patient lying in the bed with no acute distress.  EYES: Pupils equal, round, reactive to light and accommodation. No scleral icterus. Extraocular muscles intact.  HEENT: Head atraumatic, normocephalic. Oropharynx and nasopharynx clear.  NECK:  Supple, no jugular venous distention. No thyroid enlargement, no  tenderness.  LUNGS: Normal breath sounds bilaterally, no wheezing, rales,rhonchi or crepitation. No use of accessory muscles of respiration.  CARDIOVASCULAR: S1, S2 normal. No murmurs, rubs, or gallops.  ABDOMEN: Soft, nontender, nondistended. Bowel sounds present. No organomegaly or mass.  EXTREMITIES: No pedal edema, cyanosis, or clubbing. Some pain in left side hip and knee. NEUROLOGIC: Cranial nerves II through XII are intact. Muscle strength 5/5 in all extremities. Sensation intact. Gait not checked.  PSYCHIATRIC: The patient is alert and oriented x 3.  SKIN: No obvious rash, lesion, or ulcer.   ORDERS/RESULTS REVIEWED:   CBC  Recent Labs Lab 11/18/14 1807 11/19/14 0544 11/21/14 0508  WBC 7.4 6.1 8.0  HGB 10.5* 10.5* 8.5*  HCT 31.9* 30.9* 25.6*  PLT 317 303 359  MCV 99.0 99.2 99.9  MCH 32.5 33.7 33.1  MCHC 32.8 34.0 33.1  RDW 16.3* 16.1* 17.6*  LYMPHSABS 0.4*  --   --   MONOABS 0.4  --   --   EOSABS 0.1  --   --   BASOSABS 0.1  --   --    ------------------------------------------------------------------------------------------------------------------  Chemistries   Recent Labs Lab 11/18/14 1807 11/18/14 2305 11/19/14 0544 11/21/14 0508  NA 142  --  142 138  K 3.5  --  3.3* 4.2  CL 108  --  112* 110  CO2 21*  --  22 22  GLUCOSE 99  --  86 133*  BUN 22*  --  28* 28*  CREATININE 0.79  --  0.83 0.72  CALCIUM 7.8*  --  8.2* 7.8*  MG  --  2.2  --   --    ------------------------------------------------------------------------------------------------------------------ estimated creatinine clearance is 45.8 mL/min (by C-G formula  based on Cr of 0.72). ------------------------------------------------------------------------------------------------------------------ No results for input(s): TSH, T4TOTAL, T3FREE, THYROIDAB in the last 72 hours.  Invalid input(s): FREET3  Cardiac Enzymes No results for input(s): CKMB, TROPONINI, MYOGLOBIN in the last 168  hours.  Invalid input(s): CK ------------------------------------------------------------------------------------------------------------------ Invalid input(s): POCBNP ---------------------------------------------------------------------------------------------------------------  RADIOLOGY: Dg Knee 1-2 Views Left  11/21/2014  CLINICAL DATA:  Postoperative EXAM: LEFT KNEE - 1-2 VIEW COMPARISON:  11/18/2014 FINDINGS: Single intraoperative spot image over the left knee demonstrates placement of screws and anchors within the proximal tibia. No hardware complicating feature. IMPRESSION: Internal fixation of the proximal left tibial fracture as above. Electronically Signed   By: Charlett Nose M.D.   On: 11/21/2014 15:22   Dg C-arm 61-120 Min-no Report  11/21/2014  CLINICAL DATA: surgery C-ARM 61-120 MINUTES Fluoroscopy was utilized by the requesting physician.  No radiographic interpretation.   Dg Hip Operative Unilat With Pelvis Left  11/20/2014  CLINICAL DATA:  Status post total hip replacement EXAM: OPERATIVE LEFT HIP  1 VIEW TECHNIQUE: Fluoroscopic spot image(s) were submitted for interpretation post-operatively. FLUOROSCOPY TIME:  0 MINUTES 28 SECONDS; 2 SUBMITTED IMAGES COMPARISON:  None. FINDINGS: Frontal view shows total hip replacement with prosthetic components appearing well-seated. A small bony fragment is noted just superior to the intertrochanteric region medially. No dislocation apparent on this single view. IMPRESSION: Total hip prosthesis on the left appears well-seated. Small bony fragment noted medial and slightly superior to the intertrochanteric region on the left. No demonstrable dislocation. Electronically Signed   By: Bretta Bang III M.D.   On: 11/20/2014 09:12   Dg Hip Unilat W Or W/o Pelvis 2-3 Views Left  11/20/2014  CLINICAL DATA:  Hip fracture. EXAM: DG HIP (WITH OR WITHOUT PELVIS) 2-3V LEFT COMPARISON:  11/18/2014 FINDINGS: Left hip replacement has been performed in the  interval with satisfactory position and alignment. No complication identified. IMPRESSION: Satisfactory left hip replacement. Electronically Signed   By: Marlan Palau M.D.   On: 11/20/2014 09:57     ASSESSMENT AND PLAN:  Principal Problem:   Hip fracture, left (HCC) Active Problems:   Hip fracture (HCC) 1. Left hip fracture,  S/p sx 11/20/14. pain management, physical therapy . Echocardiogram revealed severe LVH and severe MR, patient is to continue ACE inhibitor, lisinopril and add low-dose of metoprolol, Possible Rehab placement over the weekend. 2. Hypokalemia, supplement orally as well as intravenously. normal magnesium level 3. Anemia. Get Hemoccult, follow with rehydration 4. Cardiac murmur due to severe mitral regurgitation, continue ACE inhibitor 5. COPD, stable. Due nebs if needed 6. Proximal left tibial fracture, Dr. Martha Clan is recommending ORIF 11/21/14.    Rehab after surgery.     Management plans discussed with the patient,social worker and they are in agreement.   DRUG ALLERGIES:  Allergies  Allergen Reactions  . Penicillins Itching, Swelling and Other (See Comments)    Has patient had a PCN reaction causing immediate rash, facial/tongue/throat swelling, SOB or lightheadedness with hypotension: No Has patient had a PCN reaction causing severe rash involving mucus membranes or skin necrosis: No Has patient had a PCN reaction that required hospitalization No Has patient had a PCN reaction occurring within the last 10 years: No If all of the above answers are "NO", then may proceed with Cephalosporin use.  . Sulfa Antibiotics Itching and Swelling    CODE STATUS:     Code Status Orders        Start     Ordered   11/18/14 2157  Full code  Continuous     11/18/14 2156      TOTAL TIME TAKING CARE OF THIS PATIENT: . 35 minutes.   Discussed with Dr. Carolynn Serve, Heath Gold M.D on 11/21/2014 at 3:34 PM  Between 7am to 6pm - Pager -  [phone]  After 6pm go to [email or web address] - password [email or web address] [facility]  [place] [facility]  Office  [phone]  CC: Primary care physician; Kandyce Rud, MD

## 2014-11-21 NOTE — Progress Notes (Signed)
Plan is for patient to D/C to [facility] Sunday or Monday pending medial clearance. Per Kim admissions coordinator at [facility] patient is going to room 201-A. RN will call report at [phone]. Clinical Child psychotherapist (CSW) sent D/C Summary to [employer] today. CSW will continue to follow and assist as needed.   Jetta Lout, LCSWA [phone]

## 2014-11-21 NOTE — Discharge Summary (Signed)
[facility] - [place] at [facility] ([facility])   PATIENT NAME: Morgan Patterson    MR#:  [id number]  DATE OF BIRTH:  Aug 20, 1940  DATE OF ADMISSION:  11/18/2014 ADMITTING PHYSICIAN: Altamese Dilling, MD  DATE OF DISCHARGE: 11/21/2014  PRIMARY CARE PHYSICIAN: Kandyce Rud, MD    ADMISSION DIAGNOSIS:  Fracture of left tibial tuberosity [S82.152A] Displaced fracture of right femoral neck, closed, initial encounter (HCC) [S72.001A]  DISCHARGE DIAGNOSIS:  Principal Problem:   Hip fracture, left (HCC) Active Problems:   Hip fracture (HCC)   Left proximal tibia fracture.  SECONDARY DIAGNOSIS:   Past Medical History  Diagnosis Date  . Arthritis   . Hypertension   . COPD (chronic obstructive pulmonary disease) (HCC)     HOSPITAL COURSE:   1. Left hip fracture,  S/p sx 11/20/14. pain management, physical therapy . Echocardiogram revealed severe LVH and severe MR, patient is to continue ACE inhibitor, lisinopril and add low-dose of metoprolol, Possible Rehab placement over the weekend. 2. Hypokalemia, supplement orally as well as intravenously. normal magnesium level 3. Anemia. Remained stable- acceptable drop as in fracture and surgery.     Iron orally on d/c. 4. Cardiac murmur due to severe mitral regurgitation, continue ACE inhibitor 5. COPD, stable. Due nebs if needed 6. Proximal left tibial fracture, Dr. Martha Clan is recommending ORIF 11/21/14.  Rehab after surgery.    DISCHARGE CONDITIONS:   Stable.  CONSULTS OBTAINED:  Treatment Team:  Kennedy Bucker, MD  DRUG ALLERGIES:   Allergies  Allergen Reactions  . Penicillins Itching, Swelling and Other (See Comments)    Has patient had a PCN reaction causing immediate rash, facial/tongue/throat swelling, SOB or lightheadedness with hypotension: No Has patient had a PCN reaction causing severe rash involving mucus membranes or skin necrosis: No Has patient had a PCN reaction that required  hospitalization No Has patient had a PCN reaction occurring within the last 10 years: No If all of the above answers are "NO", then may proceed with Cephalosporin use.  . Sulfa Antibiotics Itching and Swelling    DISCHARGE MEDICATIONS:   Current Discharge Medication List    START taking these medications   Details  bisacodyl (DULCOLAX) 10 MG suppository Place 1 suppository (10 mg total) rectally daily as needed for moderate constipation. Qty: 12 suppository, Refills: 0    docusate sodium (COLACE) 100 MG capsule Take 1 capsule (100 mg total) by mouth 2 (two) times daily. Qty: 10 capsule, Refills: 0    feeding supplement, ENSURE ENLIVE, (ENSURE ENLIVE) LIQD Take 237 mLs by mouth 3 (three) times daily with meals. Qty: 237 mL, Refills: 12    ferrous sulfate (FERROUSUL) 325 (65 FE) MG tablet Take 1 tablet (325 mg total) by mouth 2 (two) times daily with a meal. Qty: 60 tablet, Refills: 3    oxyCODONE (OXY IR/ROXICODONE) 5 MG immediate release tablet Take 1-2 tablets (5-10 mg total) by mouth every 3 (three) hours as needed for breakthrough pain. Qty: 30 tablet, Refills: 0      CONTINUE these medications which have NOT CHANGED   Details  alendronate (FOSAMAX) 70 MG tablet Take 70 mg by mouth once a week. Pt takes on Friday.   Take with a full glass of water on an empty stomach.    Calcium Carbonate-Vitamin D (CALCIUM 600+D) 600-400 MG-UNIT tablet Take 1 tablet by mouth 2 (two) times daily.    lisinopril (PRINIVIL,ZESTRIL) 20 MG tablet Take 20 mg by mouth daily.    methotrexate (RHEUMATREX) 2.5  MG tablet Take 22.5 mg by mouth once a week. Pt takes on Friday.   Caution:Chemotherapy. Protect from light.    Multiple Vitamin (MULTIVITAMIN WITH MINERALS) TABS tablet Take 1 tablet by mouth daily.    predniSONE (DELTASONE) 5 MG tablet Take 1 tablet (5 mg total) by mouth daily with breakfast. Qty: 7 tablet, Refills: 0      STOP taking these medications     aspirin 500 MG tablet           DISCHARGE INSTRUCTIONS:   Follow with ortho in 2 weeks.  If you experience worsening of your admission symptoms, develop shortness of breath, life threatening emergency, suicidal or homicidal thoughts you must seek medical attention immediately by calling 911 or calling your MD immediately  if symptoms less severe.  You Must read complete instructions/literature along with all the possible adverse reactions/side effects for all the Medicines you take and that have been prescribed to you. Take any new Medicines after you have completely understood and accept all the possible adverse reactions/side effects.   Please note  You were cared for by a hospitalist during your hospital stay. If you have any questions about your discharge medications or the care you received while you were in the hospital after you are discharged, you can call the unit and asked to speak with the hospitalist on call if the hospitalist that took care of you is not available. Once you are discharged, your primary care physician will handle any further medical issues. Please note that NO REFILLS for any discharge medications will be authorized once you are discharged, as it is imperative that you return to your primary care physician (or establish a relationship with a primary care physician if you do not have one) for your aftercare needs so that they can reassess your need for medications and monitor your lab values.    Today   CHIEF COMPLAINT:   Chief Complaint  Patient presents with  . Fall    HISTORY OF PRESENT ILLNESS:  Morgan Patterson  is a 74 y.o. female with a known history of arthritis, hypertension, COPD- tripped over her carpet on Saturday night in her back porch, and since then she had severe pain in her left hip she could not get up. She stayed in the porch Saturday night just slept they're in the next day in the morning when she woke up she opened the door and is post herself inside, then she stayed on  the floor and could not get up finally today called her causing who came to her house and he helped her to get in the bed and then called the EMS. In ER noted to have fracture on left hip. Orthopedics is planning to surgery day after tomorrow.  VITAL SIGNS:  Blood pressure 122/48, pulse 88, temperature 98.7 F (37.1 C), temperature source Oral, resp. rate 18, height 5\' 1"  (1.549 m), weight 53.479 kg (117 lb 14.4 oz), SpO2 100 %.  I/O:   Intake/Output Summary (Last 24 hours) at 11/21/14 1542 Last data filed at 11/21/14 1521  Gross per 24 hour  Intake 3013.33 ml  Output   1035 ml  Net 1978.33 ml    PHYSICAL EXAMINATION:   GENERAL: 74 y.o.-year-old patient lying in the bed with no acute distress.  EYES: Pupils equal, round, reactive to light and accommodation. No scleral icterus. Extraocular muscles intact.  HEENT: Head atraumatic, normocephalic. Oropharynx and nasopharynx clear.  NECK: Supple, no jugular venous distention. No thyroid enlargement, no  tenderness.  LUNGS: Normal breath sounds bilaterally, no wheezing, rales,rhonchi or crepitation. No use of accessory muscles of respiration.  CARDIOVASCULAR: S1, S2 normal. No murmurs, rubs, or gallops.  ABDOMEN: Soft, nontender, nondistended. Bowel sounds present. No organomegaly or mass.  EXTREMITIES: No pedal edema, cyanosis, or clubbing. Some pain in left side hip and knee. NEUROLOGIC: Cranial nerves II through XII are intact. Muscle strength 5/5 in all extremities. Sensation intact. Gait not checked.  PSYCHIATRIC: The patient is alert and oriented x 3.  SKIN: No obvious rash, lesion, or ulcer.   DATA REVIEW:   CBC  Recent Labs Lab 11/21/14 0508  WBC 8.0  HGB 8.5*  HCT 25.6*  PLT 359    Chemistries   Recent Labs Lab 11/18/14 2305  11/21/14 0508  NA  --   < > 138  K  --   < > 4.2  CL  --   < > 110  CO2  --   < > 22  GLUCOSE  --   < > 133*  BUN  --   < > 28*  CREATININE  --   < > 0.72  CALCIUM  --   < >  7.8*  MG 2.2  --   --   < > = values in this interval not displayed.  Cardiac Enzymes No results for input(s): TROPONINI in the last 168 hours.  Microbiology Results  Results for orders placed or performed during the hospital encounter of 11/18/14  Surgical pcr screen     Status: Abnormal   Collection Time: 11/19/14  2:25 AM  Result Value Ref Range Status   MRSA, PCR NEGATIVE NEGATIVE Final   Staphylococcus aureus POSITIVE (A) NEGATIVE Final    Comment:        The Xpert SA Assay (FDA approved for NASAL specimens in patients over 20 years of age), is one component of a comprehensive surveillance program.  Test performance has been validated by [facility] for patients greater than or equal to 45 year old. It is not intended to diagnose infection nor to guide or monitor treatment.     RADIOLOGY:  Dg Knee 1-2 Views Left  11/21/2014  CLINICAL DATA:  Postoperative EXAM: LEFT KNEE - 1-2 VIEW COMPARISON:  11/18/2014 FINDINGS: Single intraoperative spot image over the left knee demonstrates placement of screws and anchors within the proximal tibia. No hardware complicating feature. IMPRESSION: Internal fixation of the proximal left tibial fracture as above. Electronically Signed   By: Charlett Nose M.D.   On: 11/21/2014 15:22   Dg C-arm 61-120 Min-no Report  11/21/2014  CLINICAL DATA: surgery C-ARM 61-120 MINUTES Fluoroscopy was utilized by the requesting physician.  No radiographic interpretation.   Dg Hip Operative Unilat With Pelvis Left  11/20/2014  CLINICAL DATA:  Status post total hip replacement EXAM: OPERATIVE LEFT HIP  1 VIEW TECHNIQUE: Fluoroscopic spot image(s) were submitted for interpretation post-operatively. FLUOROSCOPY TIME:  0 MINUTES 28 SECONDS; 2 SUBMITTED IMAGES COMPARISON:  None. FINDINGS: Frontal view shows total hip replacement with prosthetic components appearing well-seated. A small bony fragment is noted just superior to the intertrochanteric region medially. No  dislocation apparent on this single view. IMPRESSION: Total hip prosthesis on the left appears well-seated. Small bony fragment noted medial and slightly superior to the intertrochanteric region on the left. No demonstrable dislocation. Electronically Signed   By: Bretta Bang III M.D.   On: 11/20/2014 09:12   Dg Hip Unilat W Or W/o Pelvis 2-3 Views Left  11/20/2014  CLINICAL DATA:  Hip fracture. EXAM: DG HIP (WITH OR WITHOUT PELVIS) 2-3V LEFT COMPARISON:  11/18/2014 FINDINGS: Left hip replacement has been performed in the interval with satisfactory position and alignment. No complication identified. IMPRESSION: Satisfactory left hip replacement. Electronically Signed   By: Marlan Palau M.D.   On: 11/20/2014 09:57     Management plans discussed with the patient, family and they are in agreement.  CODE STATUS:     Code Status Orders        Start     Ordered   11/20/14 1211  Full code   Continuous     11/20/14 1210      TOTAL TIME TAKING CARE OF THIS PATIENT: 35 minutes.    Altamese Dilling M.D on 11/21/2014 at 3:42 PM  Between 7am to 6pm - Pager - [phone]  After 6pm go to [email or web address] - password [email or web address] [facility]  [place] [facility]  Office  [phone]  CC: Primary care physician; Kandyce Rud, MD   Note: This dictation was prepared with Dragon dictation along with smaller phrase technology. Any transcriptional errors that result from this process are unintentional.

## 2014-11-22 LAB — CBC
HEMATOCRIT: 26.4 % — AB (ref 35.0–47.0)
HEMOGLOBIN: 8.4 g/dL — AB (ref 12.0–16.0)
MCH: 31.1 pg (ref 26.0–34.0)
MCHC: 31.8 g/dL — AB (ref 32.0–36.0)
MCV: 97.9 fL (ref 80.0–100.0)
Platelets: 471 10*3/uL — ABNORMAL HIGH (ref 150–440)
RBC: 2.7 MIL/uL — ABNORMAL LOW (ref 3.80–5.20)
RDW: 16.7 % — AB (ref 11.5–14.5)
WBC: 7.4 10*3/uL (ref 3.6–11.0)

## 2014-11-22 LAB — BASIC METABOLIC PANEL
Anion gap: 6 (ref 5–15)
BUN: 21 mg/dL — ABNORMAL HIGH (ref 6–20)
CO2: 22 mmol/L (ref 22–32)
Calcium: 8 mg/dL — ABNORMAL LOW (ref 8.9–10.3)
Chloride: 112 mmol/L — ABNORMAL HIGH (ref 101–111)
Creatinine, Ser: 0.75 mg/dL (ref 0.44–1.00)
GFR calc Af Amer: >60 mL/min (ref >60)
GFR calc non Af Amer: >60 mL/min (ref >60)
Glucose, Bld: 83 mg/dL (ref 65–99)
Potassium: 4.4 mmol/L (ref 3.5–5.1)
Sodium: 140 mmol/L (ref 135–145)

## 2014-11-22 MED ORDER — LORATADINE 10 MG PO TABS
10.0000 mg | ORAL_TABLET | Freq: Every day | ORAL | Status: DC
  Administered 2014-11-22 – 2014-11-23 (×2): 10 mg via ORAL
  Filled 2014-11-22 (×2): qty 1

## 2014-11-22 NOTE — Evaluation (Signed)
Physical Therapy Evaluation Patient Details Name: Morgan Patterson MRN: [id number] DOB: 1940-09-20 Today's Date: 11/22/2014   History of Present Illness  Pt fell at home and layed on the floor for nearly 3 days.  She had a L total hip replacement 11/3 and tibia ORIF 11/4  Clinical Impression  Pt is limited with eval secondary to pain, KI and generally not being confident with mobility.  She does however show good effort with the 10 minutes of exercises apart from the exam and though generally limited she shows ability to take a few small steps and control descent to sitting.    Follow Up Recommendations SNF    Equipment Recommendations  Rolling walker with 5" wheels    Recommendations for Other Services       Precautions / Restrictions Precautions Precautions: Fall Required Braces or Orthoses: Knee Immobilizer - Left Restrictions Weight Bearing Restrictions: Yes LLE Weight Bearing: Partial weight bearing      Mobility  Bed Mobility Overal bed mobility: Needs Assistance Bed Mobility: Supine to Sit     Supine to sit: Mod assist;Min assist        Transfers Overall transfer level: Needs assistance Equipment used: Rolling walker (2 wheeled) Transfers: Sit to/from Stand Sit to Stand: Min assist         General transfer comment: Pt shows good effort in getting to standing, L LE unable to assist until she is upright.  Ambulation/Gait Ambulation/Gait assistance: Mod assist;Min assist Ambulation Distance (Feet): 5 Feet Assistive device: Rolling walker (2 wheeled)       General Gait Details: Pt is hesitant with ambualtion and lacks confidence but she is able to take some small steps and maintain PWBing on the L with the walker.  She fatigues with this limited effort, but generally does well and is safe.  Stairs            Wheelchair Mobility    Modified Rankin (Stroke Patients Only)       Balance                                              Pertinent Vitals/Pain Pain Assessment: 0-10 Pain Score: 8     Home Living Family/patient expects to be discharged to:: Skilled nursing facility Living Arrangements: Children                    Prior Function Level of Independence: Independent               Hand Dominance        Extremity/Trunk Assessment   Upper Extremity Assessment: Overall WFL for tasks assessed           Lower Extremity Assessment: LLE deficits/detail   LLE Deficits / Details: pt grossly shows 3+/5 strength in L LE     Communication   Communication: No difficulties  Cognition Arousal/Alertness: Awake/alert   Overall Cognitive Status: Within Functional Limits for tasks assessed                      General Comments      Exercises Total Joint Exercises Ankle Circles/Pumps: 10 reps;Strengthening Quad Sets: Strengthening;10 reps Gluteal Sets: Strengthening;10 reps Hip ABduction/ADduction: AROM;10 reps Straight Leg Raises: AAROM;10 reps      Assessment/Plan    PT Assessment Patient needs continued PT services  PT Diagnosis Difficulty  walking;Generalized weakness   PT Problem List Decreased strength;Decreased range of motion;Decreased activity tolerance;Decreased balance;Decreased mobility;Decreased coordination;Decreased cognition;Decreased knowledge of use of DME;Decreased safety awareness  PT Treatment Interventions DME instruction;Gait training;Stair training;Functional mobility training;Therapeutic activities;Therapeutic exercise;Balance training;Neuromuscular re-education   PT Goals (Current goals can be found in the Care Plan section) Acute Rehab PT Goals Patient Stated Goal: "Get back to walking normally" PT Goal Formulation: With patient Time For Goal Achievement: 12/06/14 Potential to Achieve Goals: Good    Frequency BID   Barriers to discharge        Co-evaluation               End of Session Equipment Utilized During Treatment: Gait  belt Activity Tolerance: Patient limited by pain;Patient limited by fatigue Patient left: with bed alarm set           Time: [phone] PT Time Calculation (min) (ACUTE ONLY): 24 min   Charges:   PT Evaluation $Initial PT Evaluation Tier I: 1 Procedure PT Treatments $Therapeutic Exercise: 8-22 mins   PT G Codes:       Loran Senters, PT, DPT [phone]  Malachi Pro 11/22/2014, 1:01 PM

## 2014-11-22 NOTE — Progress Notes (Signed)
Physical Therapy Treatment Patient Details Name: Morgan Patterson MRN: [id number] DOB: 13-Sep-1940 Today's Date: 11/22/2014    History of Present Illness Pt fell at home and layed on the floor for nearly 3 days.  She had a L total hip replacement 11/3 and tibia ORIF 11/4    PT Comments    Pt with a lot of questions about her prospects and very concerned about who will care for her dog at home and would like to go home; educated and answered regarding the need for SNF (safety, recovery, education, etc).  Pt shows good effort with exercises but is not interested in doing mobility this PM session.    Follow Up Recommendations  SNF     Equipment Recommendations  Rolling walker with 5" wheels    Recommendations for Other Services       Precautions / Restrictions Precautions Precautions: Fall Required Braces or Orthoses: Knee Immobilizer - Left Restrictions LLE Weight Bearing: Partial weight bearing    Mobility  Bed Mobility               General bed mobility comments: Pt requests not to do any mobility this afternoon, anxious, tired, hurting  Transfers                    Ambulation/Gait                 Stairs            Wheelchair Mobility    Modified Rankin (Stroke Patients Only)       Balance                                    Cognition Arousal/Alertness: Awake/alert Behavior During Therapy: Restless;Anxious Overall Cognitive Status: Within Functional Limits for tasks assessed                      Exercises Total Joint Exercises Ankle Circles/Pumps: Strengthening;15 reps Quad Sets: Strengthening;15 reps Gluteal Sets: Strengthening;15 reps Hip ABduction/ADduction: AROM;15 reps Straight Leg Raises: 15 reps;AAROM    General Comments        Pertinent Vitals/Pain Pain Score: 6     Home Living                      Prior Function            PT Goals (current goals can now be found in the care  plan section)      Frequency  BID    PT Plan Current plan remains appropriate    Co-evaluation             End of Session   Activity Tolerance: Patient limited by pain;Patient limited by fatigue Patient left: with bed alarm set     Time: [id number] PT Time Calculation (min) (ACUTE ONLY): 25 min  Charges:  $Therapeutic Exercise: 23-37 mins                    G Codes:     Loran Senters, PT, DPT [phone]  Malachi Pro 11/22/2014, 7:06 PM

## 2014-11-22 NOTE — Progress Notes (Signed)
Foley d/c'd at 0500. 

## 2014-11-22 NOTE — Progress Notes (Addendum)
  Subjective: 1 Day Post-Op Procedure(s) (LRB): OPEN REDUCTION INTERNAL FIXATION (ORIF) TIBIA FRACTURE (Left)  2 day postop procedure: Left total hip arthroplasty, anterior Patient reports pain as moderate.   Patient seen in rounds with Dr. Ernest Pine. Patient is well, and has had no acute complaints or problems Plan is to go Rehab after hospital stay. Negative for chest pain and shortness of breath Fever: no Gastrointestinal: Negative for nausea and vomiting  Objective: Vital signs in last 24 hours: Temp:  [97.4 F (36.3 C)-98.7 F (37.1 C)] 98.5 F (36.9 C) (11/05 0454) Pulse Rate:  [39-109] 88 (11/05 0454) Resp:  [16-20] 18 (11/05 0454) BP: (111-145)/(48-85) 145/61 mmHg (11/05 0454) SpO2:  [92 %-100 %] 100 % (11/05 0454)  Intake/Output from previous day:  Intake/Output Summary (Last 24 hours) at 11/22/14 0621 Last data filed at 11/22/14 0510  Gross per 24 hour  Intake 2689.58 ml  Output    910 ml  Net 1779.58 ml    Intake/Output this shift: Total I/O In: 941.3 [I.V.:891.3; IV Piggyback:50] Out: 550 [Urine:550]  Labs:  Recent Labs  11/21/14 0508 11/22/14 0318  HGB 8.5* 8.4*    Recent Labs  11/21/14 0508 11/22/14 0318  WBC 8.0 7.4  RBC 2.57* 2.70*  HCT 25.6* 26.4*  PLT 359 471*    Recent Labs  11/21/14 0508 11/22/14 0318  NA 138 140  K 4.2 4.4  CL 110 112*  CO2 22 22  BUN 28* 21*  CREATININE 0.72 0.75  GLUCOSE 133* 83  CALCIUM 7.8* 8.0*   No results for input(s): LABPT, INR in the last 72 hours.   EXAM General - Patient is Alert and Oriented Extremity - Sensation intact distally Dorsiflexion/Plantar flexion intact Compartment soft Dressing/Incision - clean, dry, no drainage on the hip incision Motor Function - intact, moving foot and toes well on exam. The patient is in a knee immobilizer with no knee range of motion tested.  Past Medical History  Diagnosis Date  . Arthritis   . Hypertension   . COPD (chronic obstructive pulmonary  disease) (HCC)     Assessment/Plan: 1 Day Post-Op Procedure(s) (LRB): OPEN REDUCTION INTERNAL FIXATION (ORIF) TIBIA FRACTURE (Left)  2 day postop procedure: Left total hip arthroplasty anterior for femoral neck fracture. Principal Problem:   Hip fracture, left (HCC) Active Problems:   Hip fracture (HCC)  Estimated body mass index is 22.29 kg/(m^2) as calculated from the following:   Height as of this encounter: 5\' 1"  (1.549 m).   Weight as of this encounter: 53.479 kg (117 lb 14.4 oz). Advance diet Up with therapy  DVT Prophylaxis - Lovenox, Foot Pumps and TED hose Questions about weightbearing status to be deferred to Dr. due to tibial repair (may be WBAT from the standpoint of the hip).  Martha Clan, PA-C Orthopaedic Surgery 11/22/2014, 6:21 AM   13/05/2014. Illene Labrador M.D.

## 2014-11-22 NOTE — Progress Notes (Signed)
[facility]   PATIENT NAME: Morgan Patterson    MR#:  [id number]  DATE OF BIRTH:  1940-10-01  SUBJECTIVE:  CHIEF COMPLAINT:   Chief Complaint  Patient presents with  . Fall   patient is 74 year old Caucasian female with history of rheumatoid arthritis and recent left leg  proximal tibia fracture is back to the hospital with complaints of left leg pain after fall on the back porch. She was noted to have displaced left femoral neck fracture. S/p sx on hip 11/20/14- also have comminuted fracture of tibia-status post surgery yesterday, 11/21/14.   No complaint.  Review of Systems  Constitutional: Negative for fever, chills and weight loss.  HENT: Negative for congestion.   Eyes: Negative for blurred vision and double vision.  Respiratory: Negative for cough, sputum production, shortness of breath and wheezing.   Cardiovascular: Negative for chest pain, palpitations, orthopnea, leg swelling and PND.  Gastrointestinal: Negative for nausea, vomiting, abdominal pain, diarrhea, constipation and blood in stool.  Genitourinary: Negative for dysuria, urgency, frequency and hematuria.  Musculoskeletal: Negative for falls.  Neurological: Negative for dizziness, tremors, focal weakness and headaches.  Endo/Heme/Allergies: Does not bruise/bleed easily.  Psychiatric/Behavioral: Negative for depression. The patient does not have insomnia.     VITAL SIGNS: Blood pressure 156/71, pulse 82, temperature 98.9 F (37.2 C), temperature source Oral, resp. rate 18, height 5\' 1"  (1.549 m), weight 53.479 kg (117 lb 14.4 oz), SpO2 100 %.  PHYSICAL EXAMINATION:   GENERAL:  74 y.o.-year-old patient lying in the bed with no acute distress.  EYES: Pupils equal, round, reactive to light and accommodation. No scleral icterus. Extraocular muscles intact.  HEENT: Head atraumatic, normocephalic. Oropharynx and nasopharynx clear.  NECK:  Supple, no jugular venous distention.  No thyroid enlargement, no tenderness.  LUNGS: Normal breath sounds bilaterally, no wheezing, rales,rhonchi or crepitation. No use of accessory muscles of respiration.  CARDIOVASCULAR: S1, S2 normal. No murmurs, rubs, or gallops.  ABDOMEN: Soft, nontender, nondistended. Bowel sounds present. No organomegaly or mass.  EXTREMITIES: No pedal edema, cyanosis, or clubbing. Some pain in left side hip and knee. NEUROLOGIC: Cranial nerves II through XII are intact. Muscle strength 3-4/5 in all extremities. Sensation intact. Gait not checked.  PSYCHIATRIC: The patient is alert and oriented x 3.  SKIN: No obvious rash, lesion, or ulcer.   ORDERS/RESULTS REVIEWED:   CBC  Recent Labs Lab 11/18/14 1807 11/19/14 0544 11/21/14 0508 11/22/14 0318  WBC 7.4 6.1 8.0 7.4  HGB 10.5* 10.5* 8.5* 8.4*  HCT 31.9* 30.9* 25.6* 26.4*  PLT 317 303 359 471*  MCV 99.0 99.2 99.9 97.9  MCH 32.5 33.7 33.1 31.1  MCHC 32.8 34.0 33.1 31.8*  RDW 16.3* 16.1* 17.6* 16.7*  LYMPHSABS 0.4*  --   --   --   MONOABS 0.4  --   --   --   EOSABS 0.1  --   --   --   BASOSABS 0.1  --   --   --    ------------------------------------------------------------------------------------------------------------------  Chemistries   Recent Labs Lab 11/18/14 1807 11/18/14 2305 11/19/14 0544 11/21/14 0508 11/22/14 0318  NA 142  --  142 138 140  K 3.5  --  3.3* 4.2 4.4  CL 108  --  112* 110 112*  CO2 21*  --  22 22 22   GLUCOSE 99  --  86 133* 83  BUN 22*  --  28* 28* 21*  CREATININE 0.79  --  0.83  0.72 0.75  CALCIUM 7.8*  --  8.2* 7.8* 8.0*  MG  --  2.2  --   --   --    ------------------------------------------------------------------------------------------------------------------ estimated creatinine clearance is 45.8 mL/min (by C-G formula based on Cr of 0.75). ------------------------------------------------------------------------------------------------------------------ No results for input(s): TSH, T4TOTAL,  T3FREE, THYROIDAB in the last 72 hours.  Invalid input(s): FREET3  Cardiac Enzymes No results for input(s): CKMB, TROPONINI, MYOGLOBIN in the last 168 hours.  Invalid input(s): CK ------------------------------------------------------------------------------------------------------------------ Invalid input(s): POCBNP ---------------------------------------------------------------------------------------------------------------  RADIOLOGY: Dg Knee 1-2 Views Left  11/21/2014  CLINICAL DATA:  Postoperative EXAM: LEFT KNEE - 1-2 VIEW COMPARISON:  11/18/2014 FINDINGS: Single intraoperative spot image over the left knee demonstrates placement of screws and anchors within the proximal tibia. No hardware complicating feature. IMPRESSION: Internal fixation of the proximal left tibial fracture as above. Electronically Signed   By: Charlett Nose M.D.   On: 11/21/2014 15:22   Dg Knee Left Port  11/21/2014  CLINICAL DATA:  Status post internal fixation of tibial fracture. EXAM: PORTABLE LEFT KNEE - 1-2 VIEW COMPARISON:  Same day. FINDINGS: Status post screw fixation of left tibial plateau fracture. Midline surgical staples are noted. Good alignment of fracture components is noted. IMPRESSION: Status post surgical internal fixation of left tibial plateau fracture. Electronically Signed   By: Lupita Raider, M.D.   On: 11/21/2014 16:33   Dg C-arm 61-120 Min-no Report  11/21/2014  CLINICAL DATA: surgery C-ARM 61-120 MINUTES Fluoroscopy was utilized by the requesting physician.  No radiographic interpretation.     ASSESSMENT AND PLAN:  Principal Problem:   Hip fracture, left (HCC) Active Problems:   Hip fracture (HCC) 1. Left hip fracture,  S/p sx 11/20/14. pain management, physical therapy . Echocardiogram revealed severe LVH and severe MR, patient is to continue ACE inhibitor, lisinopril and added low-dose of metoprolol.  2. Hypokalemia, supplement orally as well as intravenously. Improved. normal  magnesium level 3. Anemia. Stable.  4. Cardiac murmur due to severe mitral regurgitation, continue ACE inhibitor 5. COPD, stable. Duonebs if needed 6. Proximal left tibial fracture, status post surgery, PT and DVT prophylaxis.  Possible discharge to subacute rehabilitation tomorrow.  Management plans discussed with the patient,social worker and they are in agreement.   DRUG ALLERGIES:  Allergies  Allergen Reactions  . Penicillins Itching, Swelling and Other (See Comments)    Has patient had a PCN reaction causing immediate rash, facial/tongue/throat swelling, SOB or lightheadedness with hypotension: No Has patient had a PCN reaction causing severe rash involving mucus membranes or skin necrosis: No Has patient had a PCN reaction that required hospitalization No Has patient had a PCN reaction occurring within the last 10 years: No If all of the above answers are "NO", then may proceed with Cephalosporin use.  . Sulfa Antibiotics Itching and Swelling    CODE STATUS:     Code Status Orders        Start     Ordered   11/18/14 2157  Full code   Continuous     11/18/14 2156      TOTAL TIME TAKING CARE OF THIS PATIENT: 35 minutes.   Discussed with Dr. Lenord Fellers, Verneta Hamidi M.D on 11/22/2014 at 3:13 PM  Between 7am to 6pm - Pager - [phone]  After 6pm go to [email or web address] - password [email or web address] [facility]  [facility]  Office  [phone]  CC: Primary care physician; Kandyce Rud, MD

## 2014-11-22 NOTE — Anesthesia Postprocedure Evaluation (Signed)
  Anesthesia Post-op Note  Patient: Morgan Patterson  Procedure(s) Performed: Procedure(s): OPEN REDUCTION INTERNAL FIXATION (ORIF) TIBIA FRACTURE (Left)  Anesthesia type:Spinal  Patient location: Floor  Post pain: Pain level controlled  Post assessment: Post-op Vital signs reviewed, Patient's Cardiovascular Status Stable, Respiratory Function Stable, Patent Airway and No signs of Nausea or vomiting  Post vital signs: Reviewed and stable  Last Vitals:  Filed Vitals:   11/22/14 0723  BP: 136/61  Pulse: 109  Temp: 36.7 C  Resp: 18    Level of consciousness: awake, alert  and patient cooperative  Complications: No apparent anesthesia complications

## 2014-11-22 NOTE — Progress Notes (Signed)
MD paged for pt request for claritin. Hower MD to put orders in

## 2014-11-22 NOTE — Progress Notes (Signed)
ORTHOPAEDIC Progress Note  POD#1 s/p left tibial plateau ORIF. Sleeping comfortably this AM.  Past Medical History  Diagnosis Date  . Arthritis   . Hypertension   . COPD (chronic obstructive pulmonary disease) [facility])    Past Surgical History  Procedure Laterality Date  . Total hip arthroplasty Left 11/20/2014    Procedure: TOTAL HIP ARTHROPLASTY ANTERIOR APPROACH;  Surgeon: Kennedy Bucker, MD;  Location: [facility] ORS;  Service: Orthopedics;  Laterality: Left;   Social History   Social History  . Marital Status: Widowed    Spouse Name: N/A  . Number of Children: N/A  . Years of Education: N/A   Social History Main Topics  . Smoking status: Never Smoker   . Smokeless tobacco: None  . Alcohol Use: No  . Drug Use: No  . Sexual Activity: Not Asked   Other Topics Concern  . None   Social History Narrative   Family History  Problem Relation Age of Onset  . Diabetes Mother   . CAD Mother   . Hypertension Mother   . Leukemia Father    Allergies  Allergen Reactions  . Penicillins Itching, Swelling and Other (See Comments)    Has patient had a PCN reaction causing immediate rash, facial/tongue/throat swelling, SOB or lightheadedness with hypotension: No Has patient had a PCN reaction causing severe rash involving mucus membranes or skin necrosis: No Has patient had a PCN reaction that required hospitalization No Has patient had a PCN reaction occurring within the last 10 years: No If all of the above answers are "NO", then may proceed with Cephalosporin use.  . Sulfa Antibiotics Itching and Swelling   Prior to Admission medications   Medication Sig Start Date End Date Taking? Authorizing Provider  alendronate (FOSAMAX) 70 MG tablet Take 70 mg by mouth once a week. Pt takes on Friday.   Take with a full glass of water on an empty stomach.   Yes Historical Provider, MD  aspirin 500 MG tablet Take 1,000-1,500 mg by mouth 2 (two) times daily. Pt takes three tablets in the morning and  two tablets at bedtime.   Yes Historical Provider, MD  Calcium Carbonate-Vitamin D (CALCIUM 600+D) 600-400 MG-UNIT tablet Take 1 tablet by mouth 2 (two) times daily.   Yes Historical Provider, MD  lisinopril (PRINIVIL,ZESTRIL) 20 MG tablet Take 20 mg by mouth daily.   Yes Historical Provider, MD  methotrexate (RHEUMATREX) 2.5 MG tablet Take 22.5 mg by mouth once a week. Pt takes on Friday.   Caution:Chemotherapy. Protect from light.   Yes Historical Provider, MD  Multiple Vitamin (MULTIVITAMIN WITH MINERALS) TABS tablet Take 1 tablet by mouth daily.   Yes Historical Provider, MD  predniSONE (DELTASONE) 5 MG tablet Take 1 tablet (5 mg total) by mouth daily with breakfast. 11/02/14  Yes Myrna Blazer, MD  bisacodyl (DULCOLAX) 10 MG suppository Place 1 suppository (10 mg total) rectally daily as needed for moderate constipation. 11/21/14   Altamese Dilling, MD  docusate sodium (COLACE) 100 MG capsule Take 1 capsule (100 mg total) by mouth 2 (two) times daily. 11/21/14   Altamese Dilling, MD  feeding supplement, ENSURE ENLIVE, (ENSURE ENLIVE) LIQD Take 237 mLs by mouth 3 (three) times daily with meals. 11/21/14   Altamese Dilling, MD  ferrous sulfate (FERROUSUL) 325 (65 FE) MG tablet Take 1 tablet (325 mg total) by mouth 2 (two) times daily with a meal. 11/21/14   Altamese Dilling, MD  oxyCODONE (OXY IR/ROXICODONE) 5 MG immediate release tablet Take 1-2  tablets (5-10 mg total) by mouth every 3 (three) hours as needed for breakthrough pain. 11/21/14   Altamese Dilling, MD   Dg Knee 1-2 Views Left  11/21/2014  CLINICAL DATA:  Postoperative EXAM: LEFT KNEE - 1-2 VIEW COMPARISON:  11/18/2014 FINDINGS: Single intraoperative spot image over the left knee demonstrates placement of screws and anchors within the proximal tibia. No hardware complicating feature. IMPRESSION: Internal fixation of the proximal left tibial fracture as above. Electronically Signed   By: Charlett Nose M.D.    On: 11/21/2014 15:22   Dg Knee Left Port  11/21/2014  CLINICAL DATA:  Status post internal fixation of tibial fracture. EXAM: PORTABLE LEFT KNEE - 1-2 VIEW COMPARISON:  Same day. FINDINGS: Status post screw fixation of left tibial plateau fracture. Midline surgical staples are noted. Good alignment of fracture components is noted. IMPRESSION: Status post surgical internal fixation of left tibial plateau fracture. Electronically Signed   By: Lupita Raider, M.D.   On: 11/21/2014 16:33   Dg C-arm 61-120 Min-no Report  11/21/2014  CLINICAL DATA: surgery C-ARM 61-120 MINUTES Fluoroscopy was utilized by the requesting physician.  No radiographic interpretation.    Physical Exam: AFVSS Dressing C/D/I Knee immobilizer in place  Assessment/Plan: NWB LLE Continue Knee brace locked in extension PT D/C planning       11/22/2014 2:32 PM

## 2014-11-23 LAB — CBC
HCT: 30.8 % — ABNORMAL LOW (ref 35.0–47.0)
Hemoglobin: 9.7 g/dL — ABNORMAL LOW (ref 12.0–16.0)
MCH: 31.3 pg (ref 26.0–34.0)
MCHC: 31.6 g/dL — AB (ref 32.0–36.0)
MCV: 98.8 fL (ref 80.0–100.0)
PLATELETS: 549 10*3/uL — AB (ref 150–440)
RBC: 3.12 MIL/uL — ABNORMAL LOW (ref 3.80–5.20)
RDW: 17.1 % — ABNORMAL HIGH (ref 11.5–14.5)
WBC: 8 10*3/uL (ref 3.6–11.0)

## 2014-11-23 LAB — BASIC METABOLIC PANEL
Anion gap: 8 (ref 5–15)
BUN: 14 mg/dL (ref 6–20)
CO2: 25 mmol/L (ref 22–32)
CREATININE: 0.74 mg/dL (ref 0.44–1.00)
Calcium: 8.4 mg/dL — ABNORMAL LOW (ref 8.9–10.3)
Chloride: 110 mmol/L (ref 101–111)
GFR calc Af Amer: >60 mL/min (ref >60)
Glucose, Bld: 78 mg/dL (ref 65–99)
Potassium: 3.9 mmol/L (ref 3.5–5.1)
SODIUM: 143 mmol/L (ref 135–145)

## 2014-11-23 MED ORDER — ENOXAPARIN SODIUM 30 MG/0.3ML ~~LOC~~ SOLN: 30.0000 mg | Freq: Two times a day (BID) | SUBCUTANEOUS | Status: AC

## 2014-11-23 NOTE — Discharge Instructions (Signed)
Heart healthy diet. °Activity as tolerated. °

## 2014-11-23 NOTE — Progress Notes (Signed)
Patient informed me that she had $572 in our safe. Security brought to patient, however she has no family who can pick up the money. Money was put into her bag. Nurse called [facility] & spoke to [place], who will be her nurse once she arrives, and made her aware of the situation. She told me that it was not uncommon and that they will be able to secure the money once she arrives.

## 2014-11-23 NOTE — Discharge Summary (Signed)
[facility] - [place] at [facility]   PATIENT NAME: Morgan Patterson    MR#:  [id number]  DATE OF BIRTH:  [birth year]  DATE OF ADMISSION:  11/18/2014 ADMITTING PHYSICIAN: Altamese Dilling, MD  DATE OF DISCHARGE: 11/23/2014  PRIMARY CARE PHYSICIAN: Kandyce Rud, MD    ADMISSION DIAGNOSIS:  Fracture of left tibial tuberosity [S82.152A] Displaced fracture of right femoral neck, closed, initial encounter (HCC) [S72.001A]  DISCHARGE DIAGNOSIS:  Principal Problem:   Hip fracture, left (HCC) Active Problems:   Hip fracture (HCC)   Left proximal tibia fracture.  SECONDARY DIAGNOSIS:   Past Medical History  Diagnosis Date  . Arthritis   . Hypertension   . COPD (chronic obstructive pulmonary disease) (HCC)     HOSPITAL COURSE:   1. Left hip fracture,  S/p sx 11/20/14. pain management, physical therapy . Echocardiogram revealed severe LVH and severe MR, patient is to continue ACE inhibitor, lisinopril and added low-dose of metoprolol,  2. Hypokalemia, supplement orally as well as intravenously. normal magnesium level 3. Anemia. Remained stable- acceptable drop as in fracture and surgery.     Iron orally on d/c. 4. Cardiac murmur due to severe mitral regurgitation, continue ACE inhibitor 5. COPD, stable. Due nebs if needed 6. Proximal left tibial fracture, Dr. Martha Clan is recommending ORIF 11/21/14.  Rehab after surgery.    DISCHARGE CONDITIONS:   Stable.  CONSULTS OBTAINED:  Treatment Team:  Kennedy Bucker, MD  DRUG ALLERGIES:   Allergies  Allergen Reactions  . Penicillins Itching, Swelling and Other (See Comments)    Has patient had a PCN reaction causing immediate rash, facial/tongue/throat swelling, SOB or lightheadedness with hypotension: No Has patient had a PCN reaction causing severe rash involving mucus membranes or skin necrosis: No Has patient had a PCN reaction that required hospitalization No Has patient had a PCN  reaction occurring within the last 10 years: No If all of the above answers are "NO", then may proceed with Cephalosporin use.  . Sulfa Antibiotics Itching and Swelling    DISCHARGE MEDICATIONS:   Current Discharge Medication List    START taking these medications   Details  bisacodyl (DULCOLAX) 10 MG suppository Place 1 suppository (10 mg total) rectally daily as needed for moderate constipation. Qty: 12 suppository, Refills: 0    docusate sodium (COLACE) 100 MG capsule Take 1 capsule (100 mg total) by mouth 2 (two) times daily. Qty: 10 capsule, Refills: 0    enoxaparin (LOVENOX) 30 MG/0.3ML injection Inject 0.3 mLs (30 mg total) into the skin every 12 (twelve) hours. Qty: 20 mL, Refills: 0    feeding supplement, ENSURE ENLIVE, (ENSURE ENLIVE) LIQD Take 237 mLs by mouth 3 (three) times daily with meals. Qty: 237 mL, Refills: 12    ferrous sulfate (FERROUSUL) 325 (65 FE) MG tablet Take 1 tablet (325 mg total) by mouth 2 (two) times daily with a meal. Qty: 60 tablet, Refills: 3    oxyCODONE (OXY IR/ROXICODONE) 5 MG immediate release tablet Take 1-2 tablets (5-10 mg total) by mouth every 3 (three) hours as needed for breakthrough pain. Qty: 30 tablet, Refills: 0      CONTINUE these medications which have NOT CHANGED   Details  alendronate (FOSAMAX) 70 MG tablet Take 70 mg by mouth once a week. Pt takes on Friday.   Take with a full glass of water on an empty stomach.    Calcium Carbonate-Vitamin D (CALCIUM 600+D) 600-400 MG-UNIT tablet Take 1 tablet by mouth 2 (two) times  daily.    lisinopril (PRINIVIL,ZESTRIL) 20 MG tablet Take 20 mg by mouth daily.    methotrexate (RHEUMATREX) 2.5 MG tablet Take 22.5 mg by mouth once a week. Pt takes on Friday.   Caution:Chemotherapy. Protect from light.    Multiple Vitamin (MULTIVITAMIN WITH MINERALS) TABS tablet Take 1 tablet by mouth daily.    predniSONE (DELTASONE) 5 MG tablet Take 1 tablet (5 mg total) by mouth daily with  breakfast. Qty: 7 tablet, Refills: 0      STOP taking these medications     aspirin 500 MG tablet          DISCHARGE INSTRUCTIONS:   Follow with ortho in 2 weeks.  If you experience worsening of your admission symptoms, develop shortness of breath, life threatening emergency, suicidal or homicidal thoughts you must seek medical attention immediately by calling 911 or calling your MD immediately  if symptoms less severe.  You Must read complete instructions/literature along with all the possible adverse reactions/side effects for all the Medicines you take and that have been prescribed to you. Take any new Medicines after you have completely understood and accept all the possible adverse reactions/side effects.   Please note  You were cared for by a hospitalist during your hospital stay. If you have any questions about your discharge medications or the care you received while you were in the hospital after you are discharged, you can call the unit and asked to speak with the hospitalist on call if the hospitalist that took care of you is not available. Once you are discharged, your primary care physician will handle any further medical issues. Please note that NO REFILLS for any discharge medications will be authorized once you are discharged, as it is imperative that you return to your primary care physician (or establish a relationship with a primary care physician if you do not have one) for your aftercare needs so that they can reassess your need for medications and monitor your lab values.    Today   CHIEF COMPLAINT:   Chief Complaint  Patient presents with  . Fall  no complaint.  HISTORY OF PRESENT ILLNESS:  Morgan Patterson  is a 74 y.o. female with a known history of arthritis, hypertension, COPD- tripped over her carpet on Saturday night in her back porch, and since then she had severe pain in her left hip she could not get up. She stayed in the porch Saturday night just slept  they're in the next day in the morning when she woke up she opened the door and is post herself inside, then she stayed on the floor and could not get up finally today called her causing who came to her house and he helped her to get in the bed and then called the EMS. In ER noted to have fracture on left hip.  VITAL SIGNS:  Blood pressure 152/66, pulse 113, temperature 99.1 F (37.3 C), temperature source Oral, resp. rate 18, height  (1.549 m), weight 53.479 kg (117 lb 14.4 oz), SpO2 100 %.  I/O:    Intake/Output Summary (Last 24 hours) at 11/23/14 0844 Last data filed at 11/23/14 0310  Gross per 24 hour  Intake   2250 ml  Output   1250 ml  Net   1000 ml    PHYSICAL EXAMINATION:   GENERAL: 74 y.o.-year-old patient lying in the bed with no acute distress.  EYES: Pupils equal, round, reactive to light and accommodation. No scleral icterus. Extraocular muscles intact.  HEENT: Head atraumatic, normocephalic. Oropharynx and nasopharynx clear.  NECK: Supple, no jugular venous distention. No thyroid enlargement, no tenderness.  LUNGS: Normal breath sounds bilaterally, no wheezing, rales,rhonchi or crepitation. No use of accessory muscles of respiration.  CARDIOVASCULAR: S1, S2 normal. No murmurs, rubs, or gallops.  ABDOMEN: Soft, nontender, nondistended. Bowel sounds present. No organomegaly or mass.  EXTREMITIES: No pedal edema, cyanosis, or clubbing. Some pain in left side hip and knee. NEUROLOGIC: Cranial nerves II through XII are intact. Muscle strength 5/5 in all extremities. Sensation intact. Gait not checked.  PSYCHIATRIC: The patient is alert and oriented x 3.  SKIN: No obvious rash, lesion, or ulcer.   DATA REVIEW:   CBC  Recent Labs Lab 11/23/14 0235  WBC 8.0  HGB 9.7*  HCT 30.8*  PLT 549*    Chemistries   Recent Labs Lab 11/18/14 2305  11/23/14 0235  NA  --   < > 143  K  --   < > 3.9  CL  --   < > 110  CO2  --   < > 25  GLUCOSE  --   < > 78   BUN  --   < > 14  CREATININE  --   < > 0.74  CALCIUM  --   < > 8.4*  MG 2.2  --   --   < > = values in this interval not displayed.  Cardiac Enzymes No results for input(s): TROPONINI in the last 168 hours.  Microbiology Results  Results for orders placed or performed during the hospital encounter of 11/18/14  Surgical pcr screen     Status: Abnormal   Collection Time: 11/19/14  2:25 AM  Result Value Ref Range Status   MRSA, PCR NEGATIVE NEGATIVE Final   Staphylococcus aureus POSITIVE (A) NEGATIVE Final    Comment:        The Xpert SA Assay (FDA approved for NASAL specimens in patients over 109 years of age), is one component of a comprehensive surveillance program.  Test performance has been validated by [facility] for patients greater than or equal to 35 year old. It is not intended to diagnose infection nor to guide or monitor treatment.   Wound culture     Status: None (Preliminary result)   Collection Time: 11/21/14  1:50 PM  Result Value Ref Range Status   Specimen Description WOUND  Final   Special Requests NONE  Final   Gram Stain PENDING  Incomplete   Culture NO GROWTH < 24 HOURS  Final   Report Status PENDING  Incomplete    RADIOLOGY:  Dg Knee 1-2 Views Left  11/21/2014  CLINICAL DATA:  Postoperative EXAM: LEFT KNEE - 1-2 VIEW COMPARISON:  11/18/2014 FINDINGS: Single intraoperative spot image over the left knee demonstrates placement of screws and anchors within the proximal tibia. No hardware complicating feature. IMPRESSION: Internal fixation of the proximal left tibial fracture as above. Electronically Signed   By: Charlett Nose M.D.   On: 11/21/2014 15:22   Dg Knee Left Port  11/21/2014  CLINICAL DATA:  Status post internal fixation of tibial fracture. EXAM: PORTABLE LEFT KNEE - 1-2 VIEW COMPARISON:  Same day. FINDINGS: Status post screw fixation of left tibial plateau fracture. Midline surgical staples are noted. Good alignment of fracture components is  noted. IMPRESSION: Status post surgical internal fixation of left tibial plateau fracture. Electronically Signed   By: Lupita Raider, M.D.   On: 11/21/2014 16:33   Dg C-arm  61-120 Min-no Report  11/21/2014  CLINICAL DATA: surgery C-ARM 61-120 MINUTES Fluoroscopy was utilized by the requesting physician.  No radiographic interpretation.     Management plans discussed with the patient, family and they are in agreement.  CODE STATUS:     Code Status Orders        Start     Ordered   11/20/14 1211  Full code   Continuous     11/20/14 1210      TOTAL TIME TAKING CARE OF THIS PATIENT: 36 minutes.    Shaune Pollack M.D on 11/23/2014 at 8:44 AM  Between 7am to 6pm - Pager - [phone]  After 6pm go to [email or web address] - password [email or web address] [facility]  [place] [facility]  Office  [phone]  CC: Primary care physician; Kandyce Rud, MD   Note: This dictation was prepared with Dragon dictation along with smaller phrase technology. Any transcriptional errors that result from this process are unintentional.

## 2014-11-23 NOTE — Progress Notes (Addendum)
Report called to [facility] at [place] [facility]. EMS called. Belongings packed & IV removed. VSS at time of discharge.  Patient requested duoneb treatment RT called.

## 2014-11-23 NOTE — Progress Notes (Signed)
Physical Therapy Treatment Patient Details Name: Morgan Patterson MRN: [id number] DOB: 07-09-1940 Today's Date: 11/23/2014    History of Present Illness Pt fell at home and layed on the floor for nearly 3 days.  She had a L total hip replacement 11/3 and tibia ORIF 11/4    PT Comments    Pt continues to have expected pain in L LE (knee more than hip) but participate well with exercises and is able to ambulate a little further and with increased confidence today.  She is still functionally limited and though she would like to go home she realizes that this is not a safe option.  Pt relived that she was able to find a cousin to care for her dog.   Follow Up Recommendations  SNF     Equipment Recommendations  Rolling walker with 5" wheels    Recommendations for Other Services       Precautions / Restrictions Precautions Precautions: Fall Required Braces or Orthoses: Knee Immobilizer - Left Restrictions LLE Weight Bearing: Partial weight bearing    Mobility  Bed Mobility Overal bed mobility: Needs Assistance Bed Mobility: Supine to Sit     Supine to sit: Min assist     General bed mobility comments: Pt does better with getting to EOB, still needing assist with LEs to EOB and to raise trunk  Transfers Overall transfer level: Modified independent Equipment used: Rolling walker (2 wheeled) Transfers: Sit to/from Stand Sit to Stand: Min guard;Min assist         General transfer comment: Pt needing much cuing and encouragement, but is able to rise with minimal assistance  Ambulation/Gait Ambulation/Gait assistance: Min guard Ambulation Distance (Feet): 7 Feet Assistive device: Rolling walker (2 wheeled)       General Gait Details: Pt again is not very confident with ambulation and is not able to go very far, but she does remain in the walker better and shows good effort trying to advance L LE and shows good use of the walker   Stairs            Wheelchair  Mobility    Modified Rankin (Stroke Patients Only)       Balance                                    Cognition Arousal/Alertness: Awake/alert Behavior During Therapy: WFL for tasks assessed/performed Overall Cognitive Status: Within Functional Limits for tasks assessed                      Exercises Total Joint Exercises Ankle Circles/Pumps: Strengthening;15 reps Quad Sets: Strengthening;15 reps Gluteal Sets: Strengthening;15 reps Hip ABduction/ADduction: AROM;15 reps Straight Leg Raises: 15 reps;AAROM    General Comments        Pertinent Vitals/Pain Pain Score: 6     Home Living                      Prior Function            PT Goals (current goals can now be found in the care plan section) Progress towards PT goals: Progressing toward goals    Frequency  BID    PT Plan Current plan remains appropriate    Co-evaluation             End of Session Equipment Utilized During Treatment: Gait belt Activity Tolerance: Patient limited by  pain;Patient limited by fatigue Patient left: with bed alarm set     Time: 0750-0815 PT Time Calculation (min) (ACUTE ONLY): 25 min  Charges:  $Gait Training: 8-22 mins $Therapeutic Exercise: 8-22 mins                    G Codes:     Loran Senters, PT, DPT [phone]  Malachi Pro 11/23/2014, 10:03 AM

## 2014-11-23 NOTE — Progress Notes (Signed)
Patient is medically stable for D/C to [facility] today. Patient is going to room 201-A. RN will call report at [phone] and arrange EMS for transport. Per Dr. Ernest Pine patient will get a provena wound vac today that will go with her to [facility] and will need to be removed in 10 days. CSW made Armed forces training and education officer at [facility] aware of provena. CSW sent D/C Summary Friday. Patient is aware of above. Per patient her son is in jail and her only emergency contact is her cousin Margaree Mackintosh and she does not know his phone number. Please reconsult if future social work needs arise. CSW signing off.   Jetta Lout, LCSWA [phone]

## 2014-11-23 NOTE — Clinical Social Work Placement (Signed)
   CLINICAL SOCIAL WORK PLACEMENT  NOTE  Date:  11/23/2014  Patient Details  Name: Morgan Patterson MRN: [id number] Date of Birth: 1940/08/18  Clinical Social Work is seeking post-discharge placement for this patient at the Skilled  Nursing Facility level of care (*CSW will initial, date and re-position this form in  chart as items are completed):  Yes   Patient/family provided with [place] Clinical Social Work Department's list of facilities offering this level of care within the geographic area requested by the patient (or if unable, by the patient's family).  Yes   Patient/family informed of their freedom to choose among providers that offer the needed level of care, that participate in Medicare, Medicaid or managed care program needed by the patient, have an available bed and are willing to accept the patient.  Yes   Patient/family informed of 's ownership interest in [facility] and [facility], as well as of the fact that they are under no obligation to receive care at these facilities.  PASRR submitted to EDS on       PASRR number received on       Existing PASRR number confirmed on 11/19/14     FL2 transmitted to all facilities in geographic area requested by pt/family on 11/19/14     FL2 transmitted to all facilities within larger geographic area on       Patient informed that his/her managed care company has contracts with or will negotiate with certain facilities, including the following:        Yes   Patient/family informed of bed offers received.  Patient chooses bed at  [facility] )     Physician recommends and patient chooses bed at      Patient to be transferred to  [facility] ) on 11/23/14.  Patient to be transferred to facility by  [facility] )     Patient family notified on 11/23/14 of transfer.  Name of family member notified:   (Patient's son is in jail and she is alert and oriented. )     PHYSICIAN        Additional Comment:    _______________________________________________ Haig Prophet, LCSW 11/23/2014, 9:34 AM

## 2014-11-23 NOTE — Progress Notes (Addendum)
  Subjective: 2 Days Post-Op Procedure(s) (LRB): OPEN REDUCTION INTERNAL FIXATION (ORIF) TIBIA FRACTURE (Left)  3 day postop procedure: Left total hip arthroplasty, anterior Patient reports pain as moderate.   Patient seen in rounds with Dr. Ernest Pine. Patient is well, and has had no acute complaints or problems Plan is to go Rehab after hospital stay. Negative for chest pain and shortness of breath Fever: no Gastrointestinal: Negative for nausea and vomiting  Objective: Vital signs in last 24 hours: Temp:  [98.1 F (36.7 C)-98.9 F (37.2 C)] 98.7 F (37.1 C) (11/06 0422) Pulse Rate:  [82-124] 124 (11/06 0422) Resp:  [18-20] 20 (11/06 0422) BP: (136-181)/(61-89) 160/76 mmHg (11/06 0422) SpO2:  [95 %-100 %] 97 % (11/06 0422)  Intake/Output from previous day:  Intake/Output Summary (Last 24 hours) at 11/23/14 0612 Last data filed at 11/23/14 0310  Gross per 24 hour  Intake   2250 ml  Output   1475 ml  Net    775 ml    Intake/Output this shift: Total I/O In: 1650 [I.V.:1650] Out: 750 [Urine:750]  Labs:  Recent Labs  11/21/14 0508 11/22/14 0318 11/23/14 0235  HGB 8.5* 8.4* 9.7*    Recent Labs  11/22/14 0318 11/23/14 0235  WBC 7.4 8.0  RBC 2.70* 3.12*  HCT 26.4* 30.8*  PLT 471* 549*    Recent Labs  11/22/14 0318 11/23/14 0235  NA 140 143  K 4.4 3.9  CL 112* 110  CO2 22 25  BUN 21* 14  CREATININE 0.75 0.74  GLUCOSE 83 78  CALCIUM 8.0* 8.4*   No results for input(s): LABPT, INR in the last 72 hours.   EXAM General - Patient is mildly confused Extremity - Sensation intact distally Dorsiflexion/Plantar flexion intact Compartment soft Dressing/Incision - clean, dry, no drainage on the hip incision Motor Function - intact, moving foot and toes well on exam. The patient is in a knee immobilizer with no knee range of motion tested.  Past Medical History  Diagnosis Date  . Arthritis   . Hypertension   . COPD (chronic obstructive pulmonary disease)  (HCC)     Assessment/Plan: 2 Days Post-Op Procedure(s) (LRB): OPEN REDUCTION INTERNAL FIXATION (ORIF) TIBIA FRACTURE (Left)  3 day postop procedure: Left total hip arthroplasty anterior for femoral neck fracture. Principal Problem:   Hip fracture, left (HCC) Active Problems:   Hip fracture (HCC)  Estimated body mass index is 22.29 kg/(m^2) as calculated from the following:   Height as of this encounter: 5\' 1"  (1.549 m).   Weight as of this encounter: 53.479 kg (117 lb 14.4 oz). Advance diet Up with therapy  DVT Prophylaxis - Lovenox, Foot Pumps and TED hose Questions about weightbearing status to be deferred to Dr. due to tibial repair (may be WBAT from the standpoint of the hip).  Martha Clan, PA-C Orthopaedic Surgery 11/23/2014, 6:12 AM   13/06/2014. Abem Shaddix, Jr. M.D.   Addendum: Dressing saturated despite multiple reinforcement with compression dressings. Some excoriation of skin from the paper tape. Will have Prevena woundvac applied to the site. It is to remain in place for 10 days. Social worker notified about Prevena care for SND.  Helma Argyle P. Illene Labrador M.D.

## 2014-11-24 ENCOUNTER — Encounter: Payer: Self-pay | Admitting: Orthopedic Surgery

## 2014-11-24 LAB — SURGICAL PATHOLOGY

## 2014-11-25 DIAGNOSIS — E876 Hypokalemia: Secondary | ICD-10-CM | POA: Diagnosis not present

## 2014-11-25 DIAGNOSIS — J449 Chronic obstructive pulmonary disease, unspecified: Secondary | ICD-10-CM | POA: Diagnosis not present

## 2014-11-25 DIAGNOSIS — I10 Essential (primary) hypertension: Secondary | ICD-10-CM | POA: Diagnosis not present

## 2014-11-25 DIAGNOSIS — D649 Anemia, unspecified: Secondary | ICD-10-CM | POA: Diagnosis present

## 2014-11-25 LAB — ANAEROBIC CULTURE

## 2014-11-25 LAB — CBC WITH DIFFERENTIAL/PLATELET
BASOS ABS: 0.1 10*3/uL (ref 0–0.1)
Basophils Relative: 1 %
EOS PCT: 3 %
Eosinophils Absolute: 0.3 10*3/uL (ref 0–0.7)
HEMATOCRIT: 32.8 % — AB (ref 35.0–47.0)
Hemoglobin: 10.3 g/dL — ABNORMAL LOW (ref 12.0–16.0)
LYMPHS PCT: 6 %
Lymphs Abs: 0.6 10*3/uL — ABNORMAL LOW (ref 1.0–3.6)
MCH: 30.4 pg (ref 26.0–34.0)
MCHC: 31.3 g/dL — AB (ref 32.0–36.0)
MCV: 97.1 fL (ref 80.0–100.0)
MONO ABS: 1.4 10*3/uL — AB (ref 0.2–0.9)
MONOS PCT: 14 %
NEUTROS ABS: 7.9 10*3/uL — AB (ref 1.4–6.5)
Neutrophils Relative %: 76 %
PLATELETS: 503 10*3/uL — AB (ref 150–440)
RBC: 3.38 MIL/uL — ABNORMAL LOW (ref 3.80–5.20)
RDW: 16.4 % — AB (ref 11.5–14.5)
WBC: 10.3 10*3/uL (ref 3.6–11.0)

## 2014-11-25 LAB — WOUND CULTURE: Culture: NO GROWTH

## 2014-11-25 LAB — COMPREHENSIVE METABOLIC PANEL
ALT: 31 U/L (ref 14–54)
AST: 46 U/L — ABNORMAL HIGH (ref 15–41)
Albumin: 2.6 g/dL — ABNORMAL LOW (ref 3.5–5.0)
Alkaline Phosphatase: 89 U/L (ref 38–126)
Anion gap: 6 (ref 5–15)
BUN: 7 mg/dL (ref 6–20)
CHLORIDE: 101 mmol/L (ref 101–111)
CO2: 30 mmol/L (ref 22–32)
Calcium: 8.4 mg/dL — ABNORMAL LOW (ref 8.9–10.3)
Creatinine, Ser: 0.62 mg/dL (ref 0.44–1.00)
GFR calc Af Amer: >60 mL/min (ref >60)
Glucose, Bld: 121 mg/dL — ABNORMAL HIGH (ref 65–99)
POTASSIUM: 3.3 mmol/L — AB (ref 3.5–5.1)
Sodium: 137 mmol/L (ref 135–145)
Total Bilirubin: 0.9 mg/dL (ref 0.3–1.2)
Total Protein: 5.5 g/dL — ABNORMAL LOW (ref 6.5–8.1)

## 2014-11-26 ENCOUNTER — Other Ambulatory Visit
Admission: RE | Admit: 2014-11-26 | Discharge: 2014-11-26 | Disposition: A | Discharge status: Home / Self Care | Payer: Medicare Other | Source: Skilled Nursing Facility | Attending: Internal Medicine | Admitting: Internal Medicine

## 2014-11-26 DIAGNOSIS — R410 Disorientation, unspecified: Secondary | ICD-10-CM | POA: Insufficient documentation

## 2014-11-26 DIAGNOSIS — I4892 Unspecified atrial flutter: Secondary | ICD-10-CM | POA: Insufficient documentation

## 2014-11-26 LAB — CBC WITH DIFFERENTIAL/PLATELET
BASOS ABS: 0.1 10*3/uL (ref 0–0.1)
Eosinophils Absolute: 0.1 10*3/uL (ref 0–0.7)
HEMATOCRIT: 33.5 % — AB (ref 35.0–47.0)
Hemoglobin: 10.5 g/dL — ABNORMAL LOW (ref 12.0–16.0)
Lymphs Abs: 0.7 10*3/uL — ABNORMAL LOW (ref 1.0–3.6)
MCH: 30.8 pg (ref 26.0–34.0)
MCHC: 31.2 g/dL — ABNORMAL LOW (ref 32.0–36.0)
MCV: 98.6 fL (ref 80.0–100.0)
Monocytes Absolute: 0.7 10*3/uL (ref 0.2–0.9)
Monocytes Relative: 7 %
NEUTROS ABS: 8.8 10*3/uL — AB (ref 1.4–6.5)
Neutrophils Relative %: 84 %
Platelets: 522 10*3/uL — ABNORMAL HIGH (ref 150–440)
RBC: 3.4 MIL/uL — AB (ref 3.80–5.20)
RDW: 17.1 % — ABNORMAL HIGH (ref 11.5–14.5)
WBC: 10.5 10*3/uL (ref 3.6–11.0)

## 2014-11-26 LAB — COMPREHENSIVE METABOLIC PANEL
ALBUMIN: 2.7 g/dL — AB (ref 3.5–5.0)
ALT: 43 U/L (ref 14–54)
AST: 78 U/L — AB (ref 15–41)
Alkaline Phosphatase: 92 U/L (ref 38–126)
Anion gap: 9 (ref 5–15)
BILIRUBIN TOTAL: 0.5 mg/dL (ref 0.3–1.2)
BUN: 11 mg/dL (ref 6–20)
CO2: 28 mmol/L (ref 22–32)
Calcium: 8.7 mg/dL — ABNORMAL LOW (ref 8.9–10.3)
Chloride: 99 mmol/L — ABNORMAL LOW (ref 101–111)
Creatinine, Ser: 0.68 mg/dL (ref 0.44–1.00)
GFR calc Af Amer: >60 mL/min (ref >60)
GFR calc non Af Amer: >60 mL/min (ref >60)
GLUCOSE: 105 mg/dL — AB (ref 65–99)
POTASSIUM: 3.7 mmol/L (ref 3.5–5.1)
SODIUM: 136 mmol/L (ref 135–145)
TOTAL PROTEIN: 5.8 g/dL — AB (ref 6.5–8.1)

## 2014-11-26 LAB — TSH: TSH: 9.744 u[IU]/mL — ABNORMAL HIGH (ref 0.350–4.500)

## 2014-11-26 LAB — MAGNESIUM: Magnesium: 2.2 mg/dL (ref 1.7–2.4)

## 2014-11-26 LAB — SALICYLATE LEVEL: Salicylate Lvl: 12.2 mg/dL (ref 2.8–30.0)

## 2014-11-27 LAB — VITAMIN B12: Vitamin B-12: 864 pg/mL (ref 180–914)

## 2014-11-30 LAB — VITAMIN D 1,25 DIHYDROXY
VITAMIN D 1, 25 (OH) TOTAL: 61 pg/mL
VITAMIN D3 1, 25 (OH): 61 pg/mL

## 2014-12-18 ENCOUNTER — Encounter
Admission: RE | Admit: 2014-12-18 | Discharge: 2014-12-18 | Disposition: A | Discharge status: Home / Self Care | Payer: Medicare Other | Source: Ambulatory Visit | Attending: Internal Medicine | Admitting: Internal Medicine

## 2016-04-18 ENCOUNTER — Encounter: Payer: Self-pay | Admitting: Emergency Medicine

## 2016-04-18 ENCOUNTER — Emergency Department
Admission: EM | Admit: 2016-04-18 | Discharge: 2016-04-18 | Disposition: A | Discharge status: Home / Self Care | Payer: Medicare Other | Attending: Emergency Medicine | Admitting: Emergency Medicine

## 2016-04-18 ENCOUNTER — Emergency Department: Payer: Medicare Other

## 2016-04-18 DIAGNOSIS — J449 Chronic obstructive pulmonary disease, unspecified: Secondary | ICD-10-CM | POA: Diagnosis not present

## 2016-04-18 DIAGNOSIS — R531 Weakness: Secondary | ICD-10-CM | POA: Diagnosis present

## 2016-04-18 DIAGNOSIS — Z79899 Other long term (current) drug therapy: Secondary | ICD-10-CM | POA: Insufficient documentation

## 2016-04-18 DIAGNOSIS — J189 Pneumonia, unspecified organism: Secondary | ICD-10-CM

## 2016-04-18 DIAGNOSIS — R778 Other specified abnormalities of plasma proteins: Secondary | ICD-10-CM

## 2016-04-18 DIAGNOSIS — R7989 Other specified abnormal findings of blood chemistry: Secondary | ICD-10-CM | POA: Diagnosis not present

## 2016-04-18 DIAGNOSIS — Z7901 Long term (current) use of anticoagulants: Secondary | ICD-10-CM | POA: Diagnosis not present

## 2016-04-18 DIAGNOSIS — Z7982 Long term (current) use of aspirin: Secondary | ICD-10-CM | POA: Insufficient documentation

## 2016-04-18 DIAGNOSIS — E860 Dehydration: Secondary | ICD-10-CM | POA: Diagnosis not present

## 2016-04-18 DIAGNOSIS — J181 Lobar pneumonia, unspecified organism: Secondary | ICD-10-CM | POA: Insufficient documentation

## 2016-04-18 DIAGNOSIS — I10 Essential (primary) hypertension: Secondary | ICD-10-CM | POA: Insufficient documentation

## 2016-04-18 LAB — CBC WITH DIFFERENTIAL/PLATELET
BASOS ABS: 0 10*3/uL (ref 0–0.1)
BASOS PCT: 0 %
EOS ABS: 0 10*3/uL (ref 0–0.7)
Eosinophils Relative: 0 %
HCT: 41.9 % (ref 35.0–47.0)
Hemoglobin: 13.9 g/dL (ref 12.0–16.0)
LYMPHS ABS: 0.3 10*3/uL — AB (ref 1.0–3.6)
LYMPHS PCT: 2 %
MCH: 29.3 pg (ref 26.0–34.0)
MCHC: 33.1 g/dL (ref 32.0–36.0)
MCV: 88.4 fL (ref 80.0–100.0)
MONOS PCT: 1 %
Monocytes Absolute: 0.2 10*3/uL (ref 0.2–0.9)
NEUTROS ABS: 14.7 10*3/uL — AB (ref 1.4–6.5)
NEUTROS PCT: 97 %
PLATELETS: 435 10*3/uL (ref 150–440)
RBC: 4.74 MIL/uL (ref 3.80–5.20)
RDW: 21.1 % — ABNORMAL HIGH (ref 11.5–14.5)
WBC: 15.2 10*3/uL — AB (ref 3.6–11.0)

## 2016-04-18 LAB — COMPREHENSIVE METABOLIC PANEL
ALBUMIN: 3.2 g/dL — AB (ref 3.5–5.0)
ALT: 25 U/L (ref 14–54)
AST: 82 U/L — AB (ref 15–41)
Alkaline Phosphatase: 110 U/L (ref 38–126)
Anion gap: 17 — ABNORMAL HIGH (ref 5–15)
BUN: 40 mg/dL — ABNORMAL HIGH (ref 6–20)
CHLORIDE: 96 mmol/L — AB (ref 101–111)
CO2: 19 mmol/L — AB (ref 22–32)
CREATININE: 1.13 mg/dL — AB (ref 0.44–1.00)
Calcium: 8.2 mg/dL — ABNORMAL LOW (ref 8.9–10.3)
GFR calc non Af Amer: 46 mL/min — ABNORMAL LOW (ref >60)
GFR, EST AFRICAN AMERICAN: 53 mL/min — AB (ref >60)
GLUCOSE: 73 mg/dL (ref 65–99)
Potassium: 3.9 mmol/L (ref 3.5–5.1)
Sodium: 132 mmol/L — ABNORMAL LOW (ref 135–145)
Total Bilirubin: 0.3 mg/dL (ref 0.3–1.2)
Total Protein: 7.1 g/dL (ref 6.5–8.1)

## 2016-04-18 LAB — INFLUENZA PANEL BY PCR (TYPE A & B)
INFLAPCR: NEGATIVE
Influenza B By PCR: NEGATIVE

## 2016-04-18 LAB — TROPONIN I: Troponin I: 0.15 ng/mL (ref <0.03)

## 2016-04-18 MED ORDER — METHYLPREDNISOLONE SODIUM SUCC 125 MG IJ SOLR
125.0000 mg | Freq: Once | INTRAMUSCULAR | Status: AC
  Administered 2016-04-18: 125 mg via INTRAVENOUS
  Filled 2016-04-18: qty 2

## 2016-04-18 MED ORDER — LEVOFLOXACIN IN D5W 750 MG/150ML IV SOLN: 750.0000 mg | Freq: Once | INTRAVENOUS | Status: DC

## 2016-04-18 MED ORDER — IPRATROPIUM-ALBUTEROL 0.5-2.5 (3) MG/3ML IN SOLN
3.0000 mL | Freq: Once | RESPIRATORY_TRACT | Status: AC
  Administered 2016-04-18: 3 mL via RESPIRATORY_TRACT
  Filled 2016-04-18: qty 3

## 2016-04-18 MED ORDER — SODIUM CHLORIDE 0.9 % IV SOLN: Freq: Once | INTRAVENOUS | Status: DC

## 2016-04-18 MED ORDER — METOPROLOL TARTRATE 5 MG/5ML IV SOLN: 5.0000 mg | Freq: Once | INTRAVENOUS | Status: DC

## 2016-04-18 MED ORDER — LORAZEPAM 2 MG/ML IJ SOLN
0.5000 mg | Freq: Once | INTRAMUSCULAR | Status: AC
  Administered 2016-04-18: 0.5 mg via INTRAVENOUS
  Filled 2016-04-18: qty 1

## 2016-04-18 MED ORDER — ASPIRIN 81 MG PO CHEW: 324.0000 mg | CHEWABLE_TABLET | Freq: Once | ORAL | Status: DC

## 2016-04-18 NOTE — ED Provider Notes (Addendum)
[facility] Emergency Department Provider Note       Time seen: ----------------------------------------- 12:32 PM on 04/18/2016 -----------------------------------------     I have reviewed the triage vital signs and the nursing notes.   HISTORY   Chief Complaint Weakness    HPI Morgan Patterson is a 76 y.o. female who presents to the ED for weakness and cough. Patient states she's had 3 days of illness, feels weak, short of breath and has been worsening rather than getting better. She denies fevers or chills, denies body aches. She has also had some nausea with sore throat, cough and congestion. She reports vomiting twice over the past several days. Nothing makes her symptoms better or worse.   Past Medical History:  Diagnosis Date  . Arthritis   . COPD (chronic obstructive pulmonary disease) (HCC)   . Hypertension     Patient Active Problem List   Diagnosis Date Noted  . Hip fracture, left (HCC) 11/18/2014  . Hip fracture (HCC) 11/18/2014    Past Surgical History:  Procedure Laterality Date  . ORIF TIBIA FRACTURE Left 11/21/2014   Procedure: OPEN REDUCTION INTERNAL FIXATION (ORIF) TIBIA FRACTURE;  Surgeon: Juanell Fairly, MD;  Location: [facility] ORS;  Service: Orthopedics;  Laterality: Left;  . TOTAL HIP ARTHROPLASTY Left 11/20/2014   Procedure: TOTAL HIP ARTHROPLASTY ANTERIOR APPROACH;  Surgeon: Kennedy Bucker, MD;  Location: [facility] ORS;  Service: Orthopedics;  Laterality: Left;    Allergies Penicillins and Sulfa antibiotics  Social History Social History  Substance Use Topics  . Smoking status: Never Smoker  . Smokeless tobacco: Not on file  . Alcohol use No    Review of Systems Constitutional: Negative for fever. ENT: Positive for sore throat Cardiovascular: Negative for chest pain. Respiratory: Positive shortness of breath and cough, congestion Gastrointestinal: Negative for abdominal pain, positive for recent vomiting Genitourinary:  Negative for dysuria. Musculoskeletal: Negative for back pain. Skin: Negative for rash. Neurological: Negative for headaches, positive for weakness  10-point ROS otherwise negative.  ____________________________________________   PHYSICAL EXAM:  VITAL SIGNS: ED Triage Vitals  Enc Vitals Group     BP --      Pulse --      Resp --      Temp --      Temp src --      SpO2 04/18/16 1230 98 %     Weight --      Height --      Head Circumference --      Peak Flow --      Pain Score 04/18/16 1231 0     Pain Loc --      Pain Edu? --      Excl. in GC? --     Constitutional: Alert and oriented. Mild distress Eyes: Conjunctivae are normal. PERRL. Normal extraocular movements. ENT   Head: Normocephalic and atraumatic.   Nose: No congestion/rhinnorhea.   Mouth/Throat: Mucous membranes are moist.   Neck: No stridor. Cardiovascular: Rapid rate, regular rhythm. No murmurs, rubs, or gallops. Respiratory: Normal respiratory effort without tachypnea nor retractions. Breath sounds are clear and equal bilaterally. No wheezes/rales/rhonchi. Gastrointestinal: Soft and nontender. Normal bowel sounds Musculoskeletal: Nontender with normal range of motion in extremities. No lower extremity tenderness nor edema. Neurologic:  Normal speech and language. No gross focal neurologic deficits are appreciated.  Skin:  Skin is warm, dry and intact. No rash noted. Psychiatric: Mood and affect are normal. Speech and behavior are normal.  ____________________________________________  EKG: Interpreted by  me. Sinus tachycardia with a rate of 113 bpm, normal PR interval, normal QT, PAC, LVH. 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 Patient presents for weakness and respiratory  symptoms, we will assess with labs and imaging as indicated. Clinical Course as of Apr 18 1457  Mon Apr 18, 2016  1447 Patient may end up leaving AGAINST MEDICAL ADVICE. She does not want further care and does not want admission.  [JW]  1447 I have advised her that she has an elevated troponin which likely indicates mild myocardial infarction. She also appears to have developing pneumonia.  [JW]  1447 I've advised admission but she has declined repeatedly.  [JW]    Clinical Course User Index [JW] Emily Filbert, MD   Procedures ____________________________________________   LABS (pertinent positives/negatives)  Labs Reviewed  CBC WITH DIFFERENTIAL/PLATELET - Abnormal; Notable for the following:       Result Value   WBC 15.2 (*)    RDW 21.1 (*)    Neutro Abs 14.7 (*)    Lymphs Abs 0.3 (*)    All other components within normal limits  COMPREHENSIVE METABOLIC PANEL - Abnormal; Notable for the following:    Sodium 132 (*)    Chloride 96 (*)    CO2 19 (*)    BUN 40 (*)    Creatinine, Ser 1.13 (*)    Calcium 8.2 (*)    Albumin 3.2 (*)    AST 82 (*)    GFR calc non Af Amer 46 (*)    GFR calc Af Amer 53 (*)    Anion gap 17 (*)    All other components within normal limits  TROPONIN I - Abnormal; Notable for the following:    Troponin I 0.15 (*)    All other components within normal limits  INFLUENZA PANEL BY PCR (TYPE A & B)  URINALYSIS, COMPLETE (UACMP) WITH MICROSCOPIC  CBG MONITORING, ED   CRITICAL CARE Performed by: Emily Filbert   Total critical care time: 30 minutes  Critical care time was exclusive of separately billable procedures and treating other patients.  Critical care was necessary to treat or prevent imminent or life-threatening deterioration.  Critical care was time spent personally by me on the following activities: development of treatment plan with patient and/or surrogate as well as nursing, discussions with consultants, evaluation of  patient's response to treatment, examination of patient, obtaining history from patient or surrogate, ordering and performing treatments and interventions, ordering and review of laboratory studies, ordering and review of radiographic studies, pulse oximetry and re-evaluation of patient's condition.  RADIOLOGY Images were viewed by me  Chest x-ray IMPRESSION: Mild infiltrate right mid lung.  ____________________________________________  FINAL ASSESSMENT AND PLAN  Weakness, pneumonia, tachycardia, elevated troponin  Plan: Patient's labs and imaging were dictated above. Patient had presented for multiple symptoms and was found to be tachycardic with leukocytosis and possible pneumonia. Patient meets SIRS criteria but overall is. Very stable other than tachycardia. She was initially given Ativan as well as IV Levaquin. She was found have elevated troponin which may be demand related that she was given adult aspirin. She also appears to be mildly dehydrated and will receive IV fluids. She has abruptly began to refuse care and appears to be competent to make these decisions. She is actually the legal  guardian of the nail in the room with her. I've advised her that she has had a heart attack as well as developing pneumonia and she is leaving AGAINST MEDICAL ADVICE.   Emily Filbert, MD   Note: This note was generated in part or whole with voice recognition software. Voice recognition is usually quite accurate but there are transcription errors that can and very often do occur. I apologize for any typographical errors that were not detected and corrected.     Emily Filbert, MD 04/18/16 1424    Emily Filbert, MD 04/18/16 [phone]

## 2016-04-18 NOTE — ED Notes (Signed)
This RN went into room to discuss with patient the need to stay and be admitted for her elevated troponin and pneumonia.  This RN discussed risks of leaving the hospital.  Patient states, "I have obligations.  I can't stay here."  Patient appears distressed.  Patient is very hard of hearing so this RN attempted to speak loudly to ensure patient understood concerns.

## 2016-04-18 NOTE — ED Notes (Signed)
Pt refusing to stay regardless of medical need. Pt signed AMA. Pt left with son, Kinisha Soper.

## 2016-04-18 NOTE — ED Notes (Signed)
Dr. Virl Son in room to discuss condition and decision to admit due to patient's concerning test results.  Patient became agitated while MD was in room and states she does not want to stay in the hospital.  She wants to go home.

## 2016-04-18 NOTE — ED Triage Notes (Signed)
Patient presents to the ED with weakness and nausea since Friday, with sore throat, cough, and congestion.  Patient's skin is clammy upon arrival to ED. Patient is very hard of hearing.  Patient denies pain but states, "I just feel like crap."  Respirations appear even and not labored at this time.  Patient reports vomiting x 2 over the last several days.

## 2016-04-28 IMAGING — CR DG KNEE 1-2V*L*
1 series · 1 of 1 positions shown · non-contrast
Comparison: 11/18/2014

CLINICAL DATA: Postoperative

EXAM:
LEFT KNEE - 1-2 VIEW

[cont.]
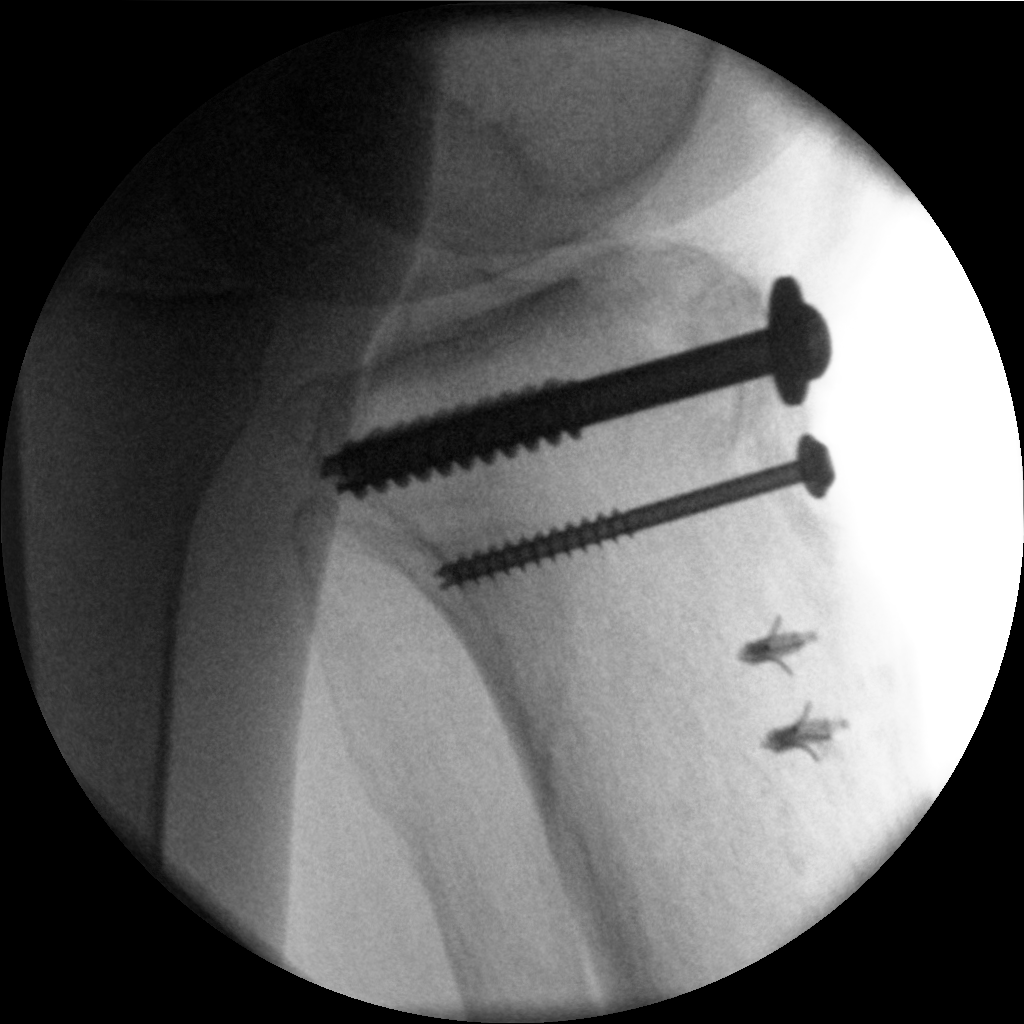

[1 of 1 positions shown; findings below may reference images not displayed]

FINDINGS: Single intraoperative spot image over the left knee demonstrates
placement of screws and anchors within the proximal tibia. No
hardware complicating feature.
IMPRESSION: Internal fixation of the proximal left tibial fracture as above.

## 2016-04-28 IMAGING — CR DG KNEE 1-2V PORT*L*
1 series · 2 of 2 positions shown · non-contrast
Comparison: Same day.

CLINICAL DATA: Status post internal fixation of tibial fracture.

EXAM:
PORTABLE LEFT KNEE - 1-2 VIEW

[Series 1: lat · 0.17mm/px · 2 of 2 slices shown]
[im 1/2]
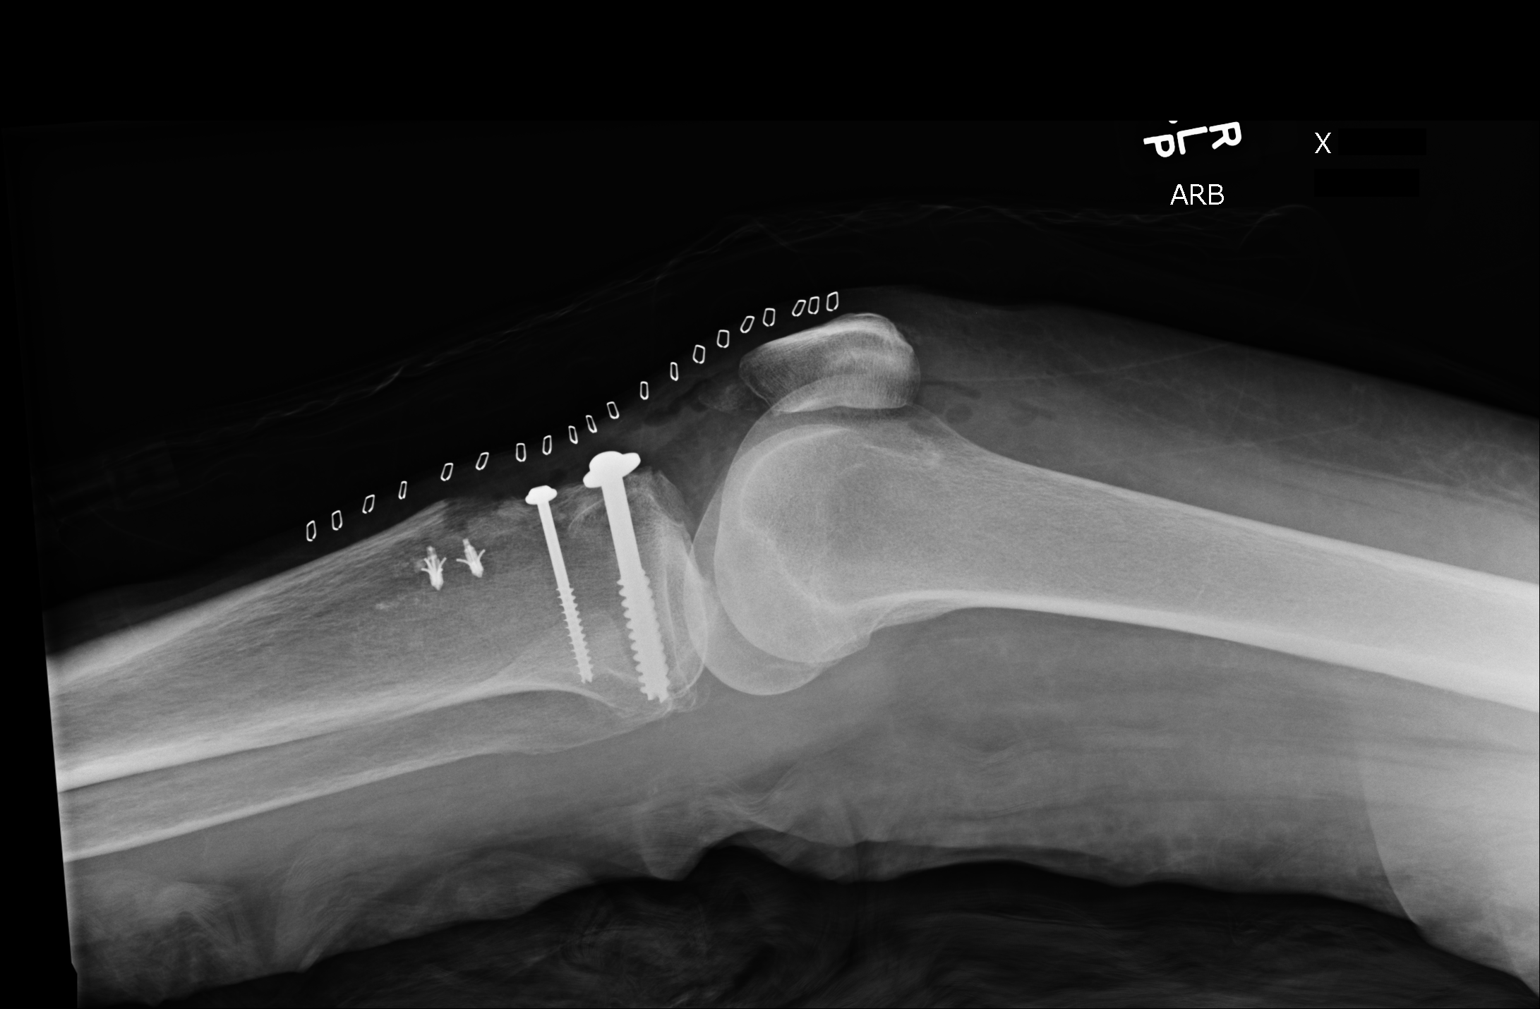
[im 2/2]
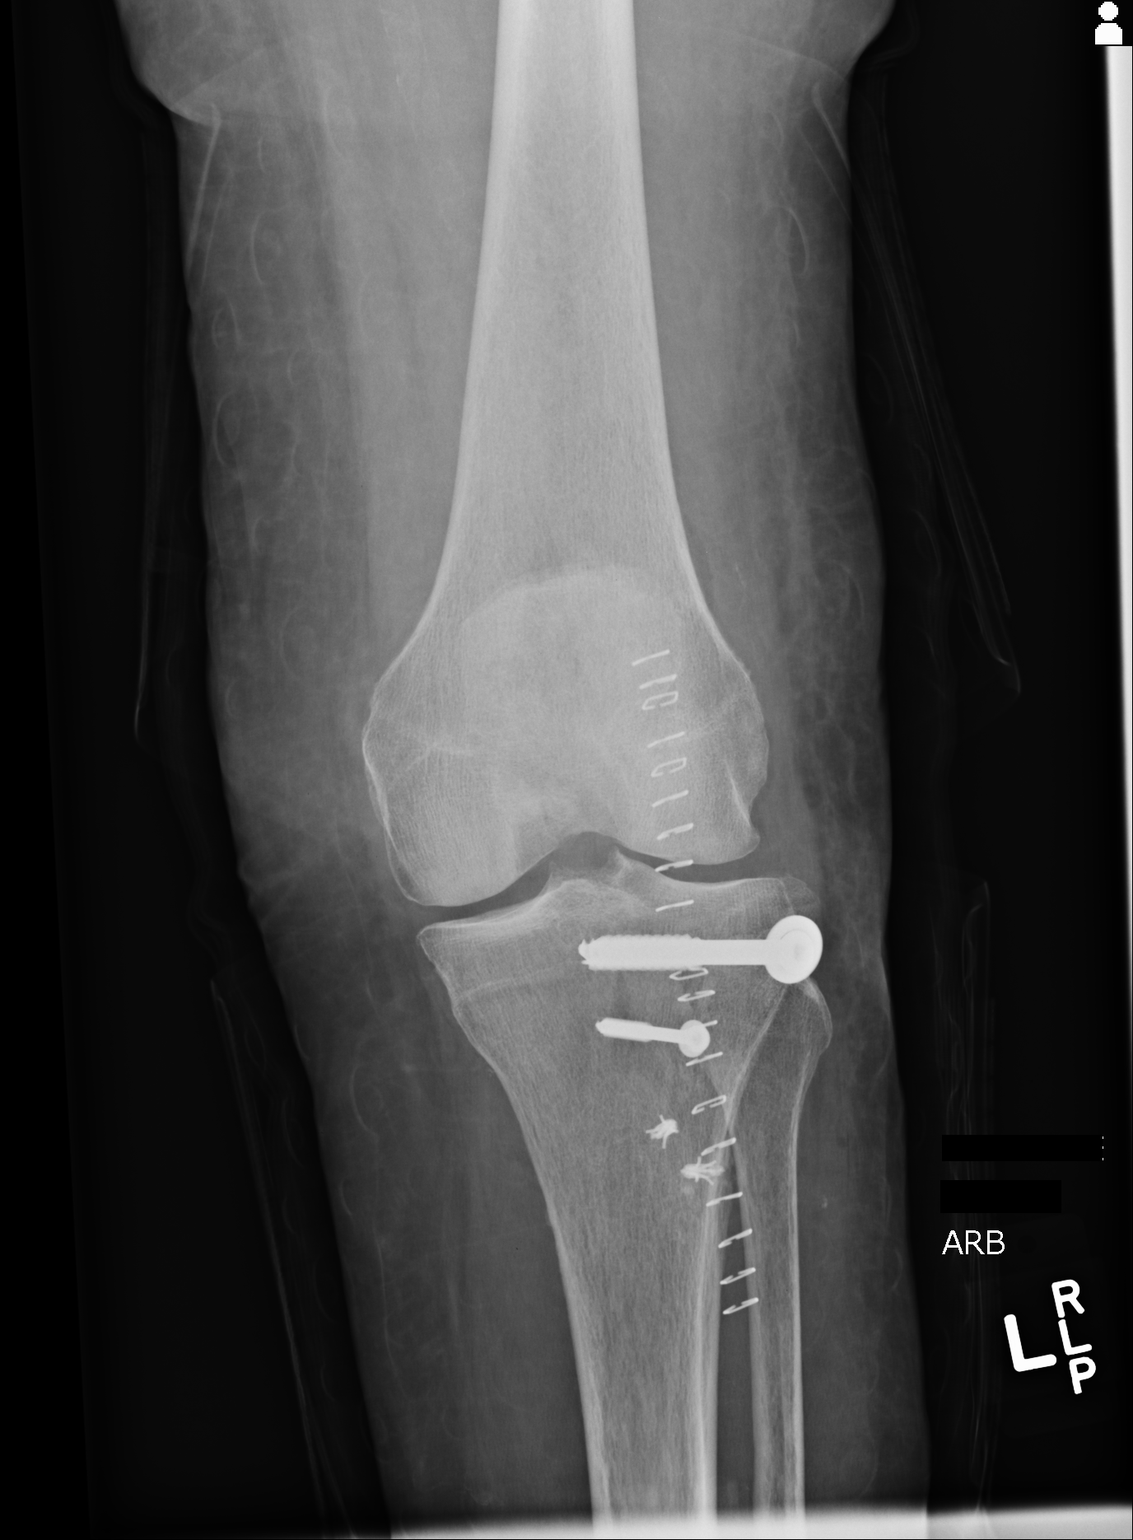

[2 of 2 positions shown; findings below may reference images not displayed]

FINDINGS: Status post screw fixation of left tibial plateau fracture. Midline
surgical staples are noted. Good alignment of fracture components is
noted.
IMPRESSION: Status post surgical internal fixation of left tibial plateau
fracture.

## 2016-05-17 DEATH — deceased

## 2017-09-24 IMAGING — CR DG CHEST 2V
2 series · 2 of 2 positions shown · non-contrast
Comparison: 11/18/2014.

CLINICAL DATA: COPD.  Hypertension.

EXAM:
CHEST  2 VIEW

[chest lat]
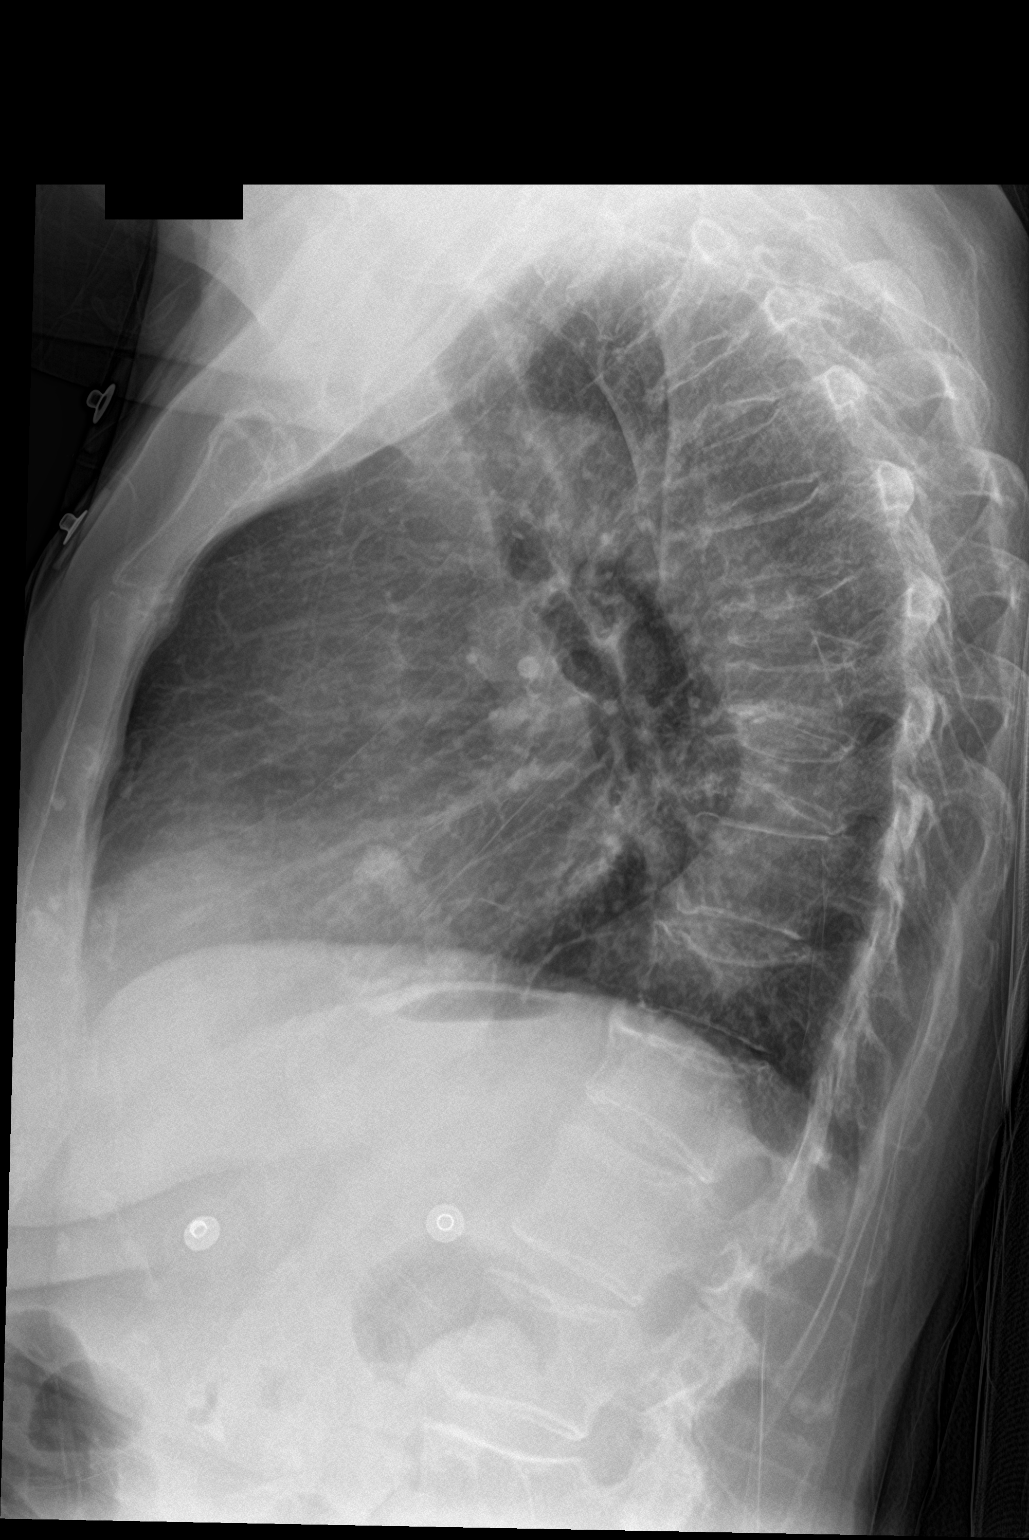

[chest ap]
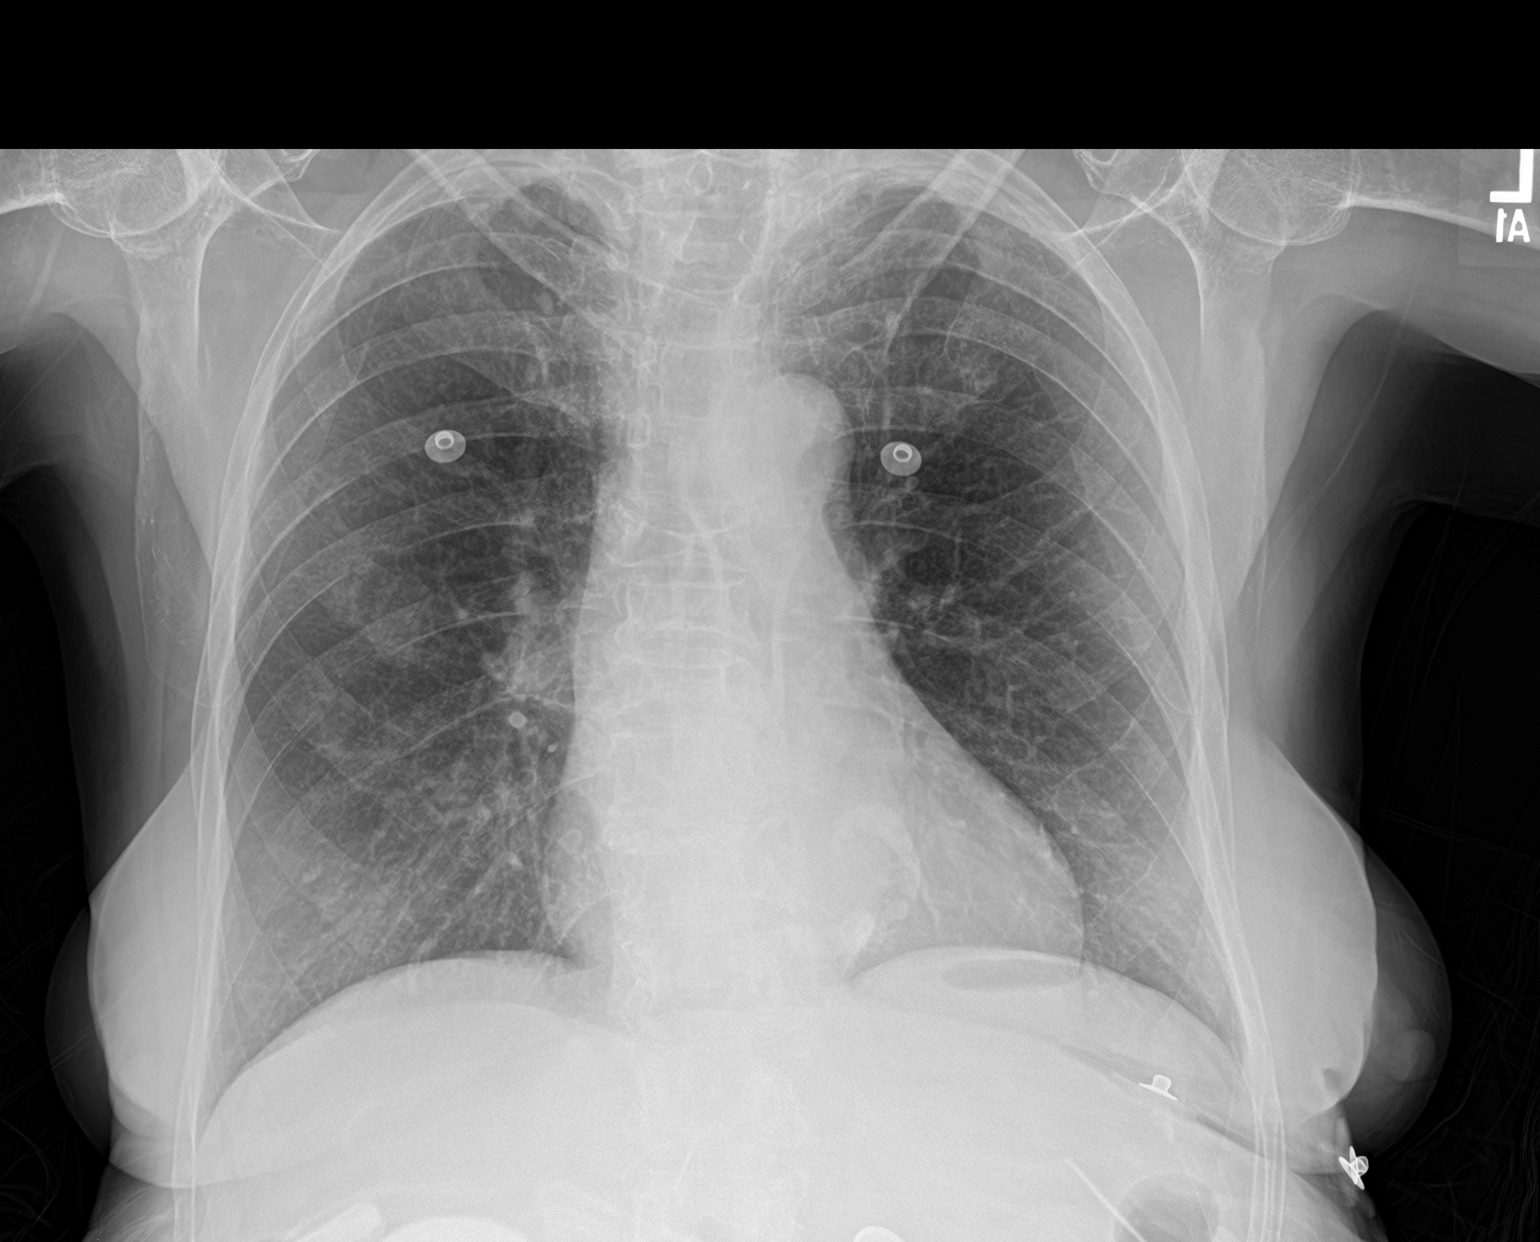

[2 of 2 positions shown; findings below may reference images not displayed]

FINDINGS: Mediastinum and hilar structures are normal. Mild infiltrate right
mid lung. No pleural effusion or pneumothorax. Mitral annular
calcification. Heart size normal. Multiple thoracic spine
compression fractures are again noted. These appears stable.
IMPRESSION: Mild infiltrate right mid lung.
